# Patient Record
Sex: Male | Born: 1975 | Race: Black or African American | Hispanic: No | Marital: Single | Smoking: Former smoker
Health system: Southern US, Community
[De-identification: ages and names within clinical notes are randomized; demographics above are authoritative.]

## PROBLEM LIST (undated history)

## (undated) DIAGNOSIS — M4800 Spinal stenosis, site unspecified: Secondary | ICD-10-CM

---

## 2007-05-09 ENCOUNTER — Emergency Department (HOSPITAL_COMMUNITY): Admission: EM | Admit: 2007-05-09 | Discharge: 2007-05-09 | Payer: Self-pay | Admitting: Emergency Medicine

## 2010-02-13 ENCOUNTER — Emergency Department (HOSPITAL_COMMUNITY): Admission: EM | Admit: 2010-02-13 | Discharge: 2010-02-13 | Payer: Self-pay | Admitting: Emergency Medicine

## 2011-05-13 LAB — GC/CHLAMYDIA PROBE AMP, GENITAL
Chlamydia, DNA Probe: POSITIVE — AB
GC Probe Amp, Genital: NEGATIVE

## 2011-07-23 IMAGING — CR DG FOOT COMPLETE 3+V*R*
3 series · 3 of 3 positions shown · non-contrast
Comparison: None.

CLINICAL DATA: Twisted ankle while playing basketball; pain across
the metatarsals.

RIGHT FOOT COMPLETE - 3+ VIEW

[t foot ap right]
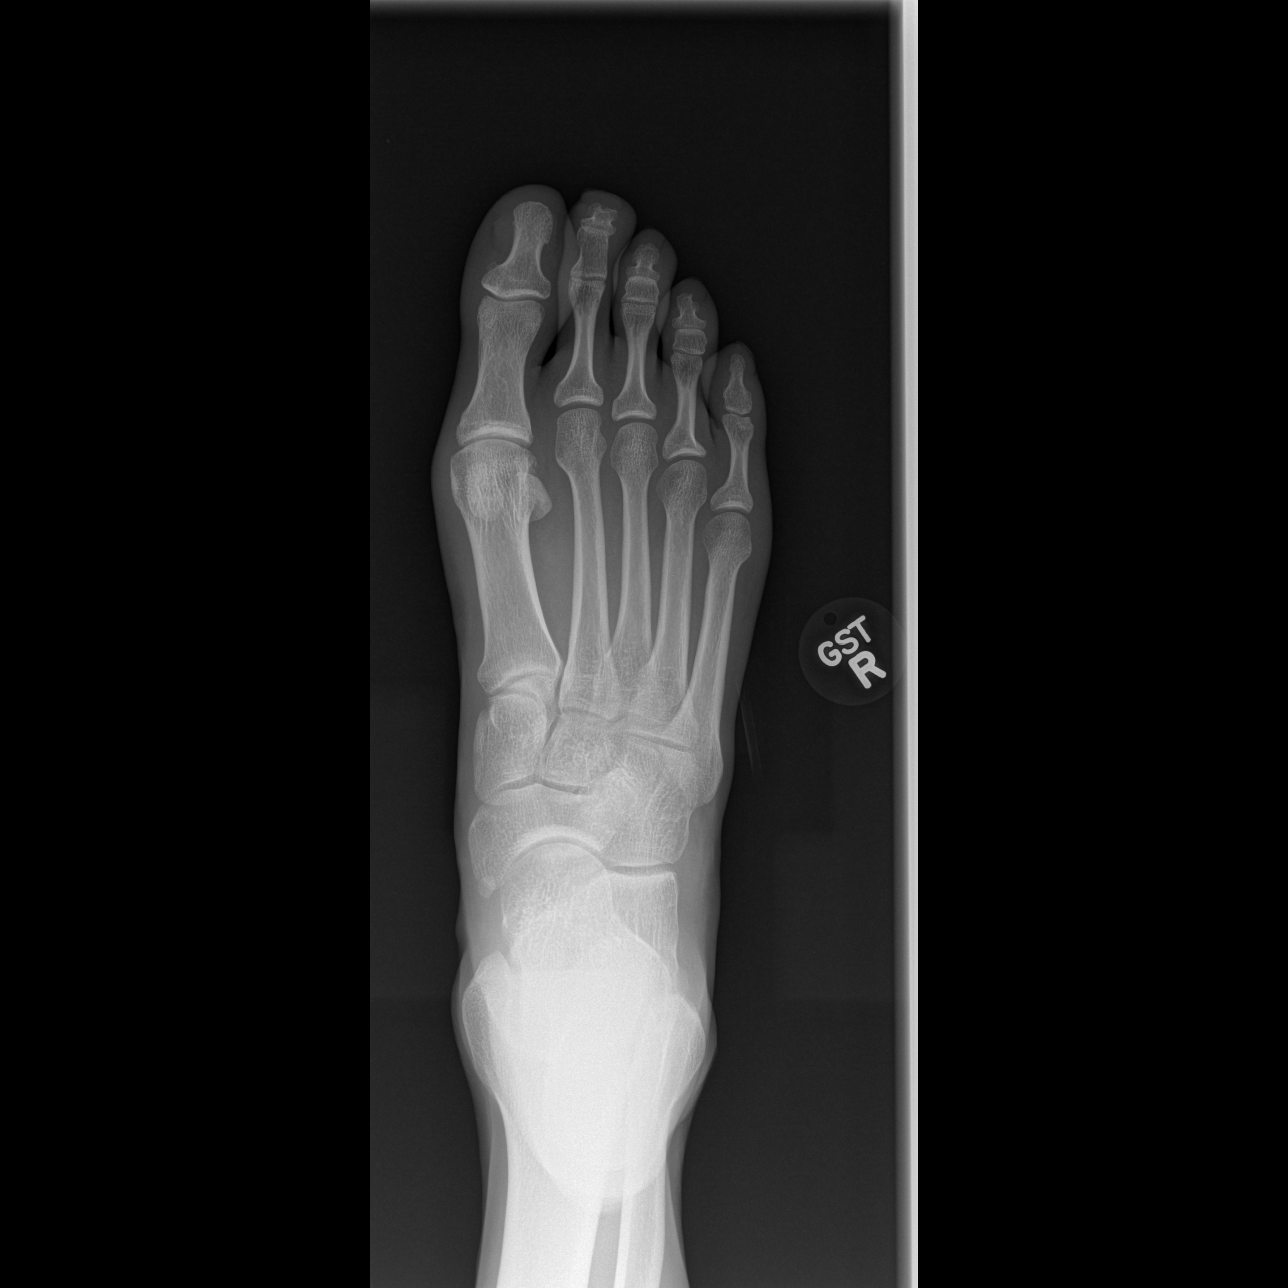

[t foot oblique right *]
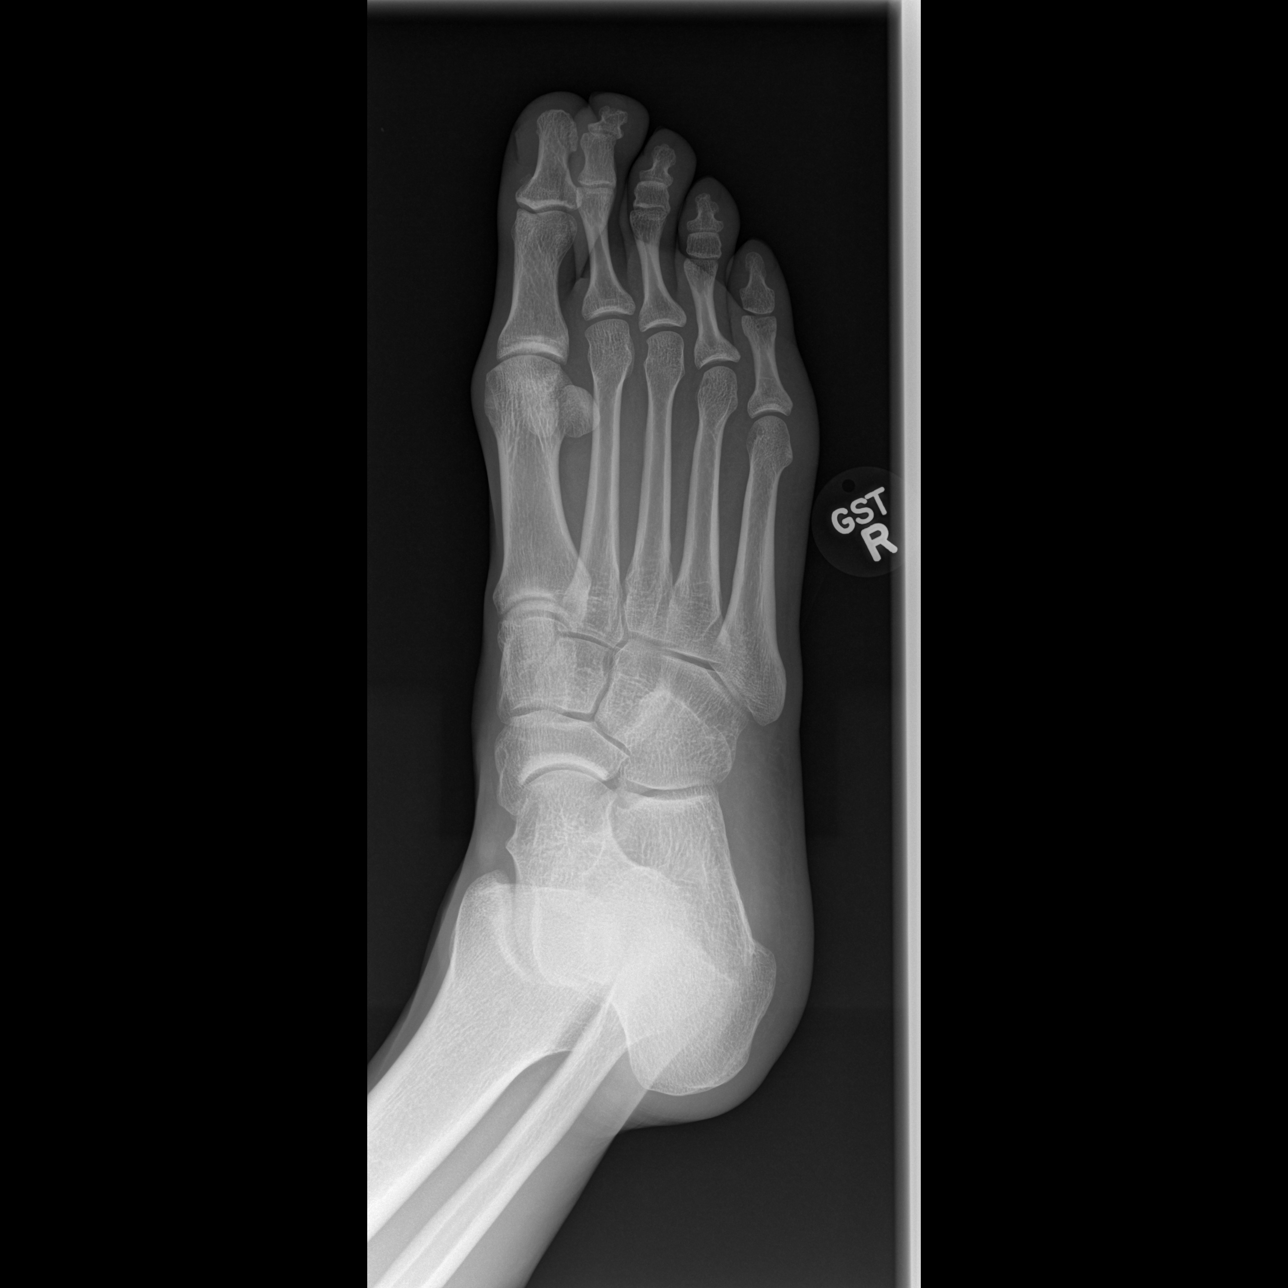

[t foot lat right *]
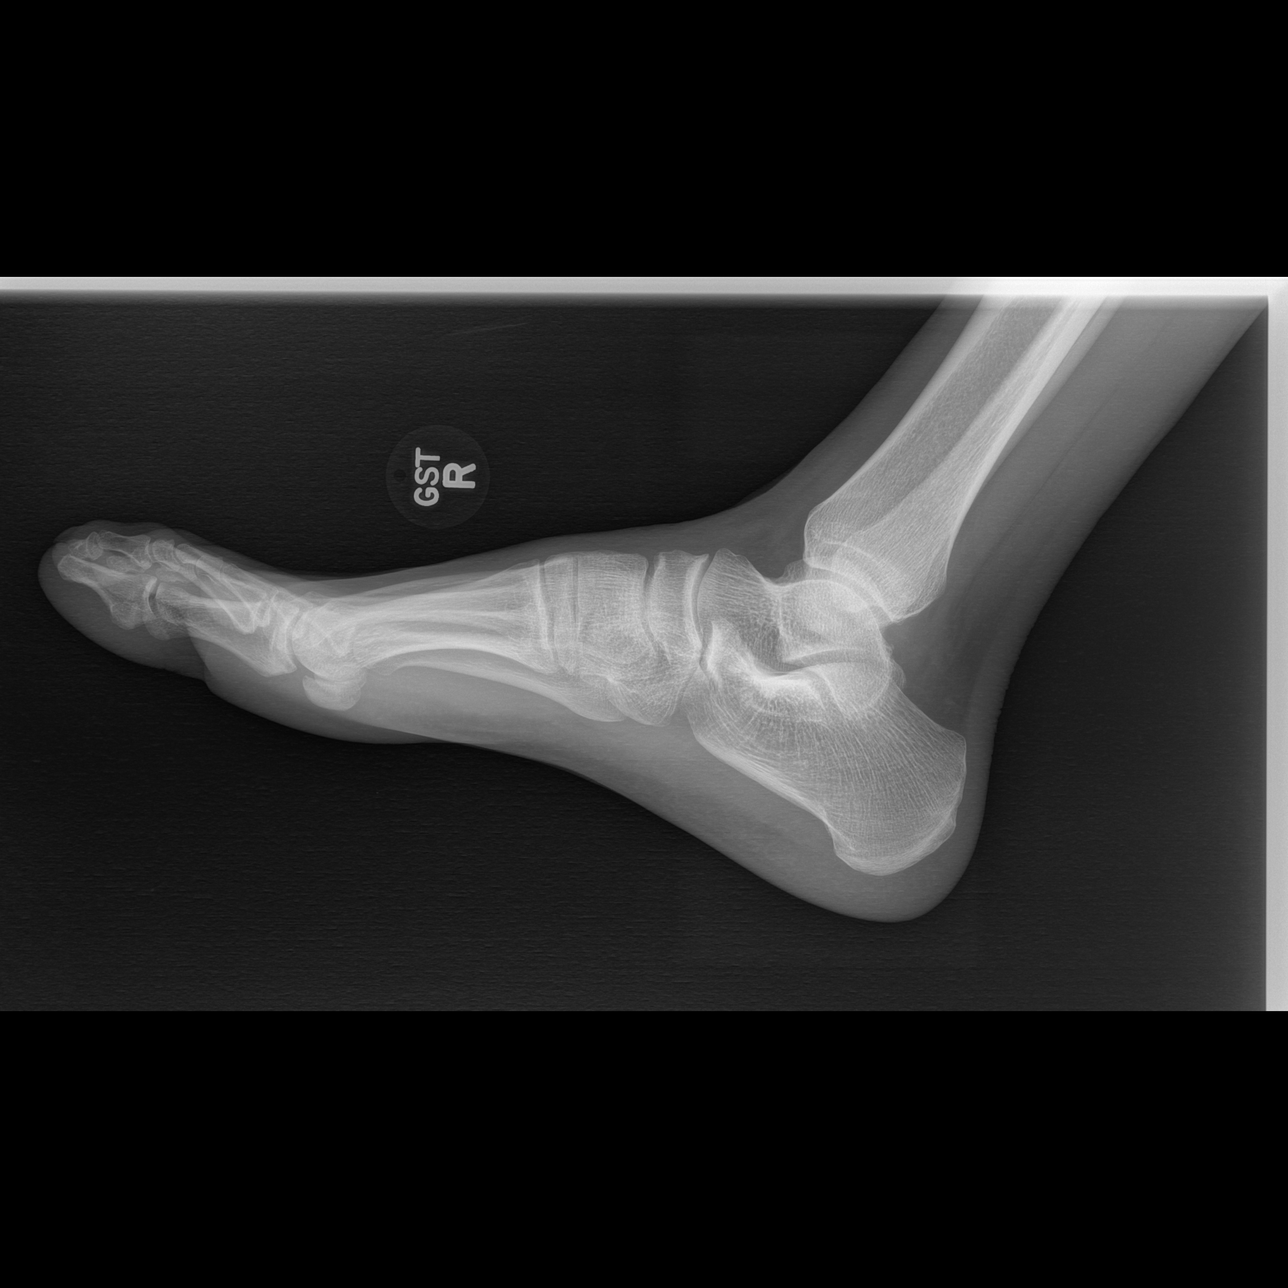

[3 of 3 positions shown; findings below may reference images not displayed]

FINDINGS: There is no evidence of fracture or dislocation.  The
joint spaces are preserved.  There is no evidence of talar
subluxation; the subtalar joint is unremarkable in appearance.

No significant soft tissue abnormalities are seen.
IMPRESSION: No evidence of fracture or dislocation.

## 2012-02-03 ENCOUNTER — Emergency Department (HOSPITAL_COMMUNITY)
Admission: EM | Admit: 2012-02-03 | Discharge: 2012-02-03 | Disposition: A | Discharge status: Home / Self Care | Payer: Self-pay | Attending: Emergency Medicine | Admitting: Emergency Medicine

## 2012-02-03 ENCOUNTER — Encounter (HOSPITAL_COMMUNITY): Payer: Self-pay | Admitting: Emergency Medicine

## 2012-02-03 DIAGNOSIS — K089 Disorder of teeth and supporting structures, unspecified: Secondary | ICD-10-CM | POA: Insufficient documentation

## 2012-02-03 DIAGNOSIS — K047 Periapical abscess without sinus: Secondary | ICD-10-CM

## 2012-02-03 DIAGNOSIS — K029 Dental caries, unspecified: Secondary | ICD-10-CM

## 2012-02-03 DIAGNOSIS — F17200 Nicotine dependence, unspecified, uncomplicated: Secondary | ICD-10-CM | POA: Insufficient documentation

## 2012-02-03 MED ORDER — OXYCODONE-ACETAMINOPHEN 5-325 MG PO TABS
2.0000 | ORAL_TABLET | Freq: Once | ORAL | Status: AC
  Administered 2012-02-03: 2 via ORAL
  Filled 2012-02-03: qty 2

## 2012-02-03 MED ORDER — IBUPROFEN 800 MG PO TABS: 800.0000 mg | ORAL_TABLET | Freq: Three times a day (TID) | ORAL | Status: DC

## 2012-02-03 MED ORDER — OXYCODONE-ACETAMINOPHEN 5-325 MG PO TABS: 2.0000 | ORAL_TABLET | ORAL | Status: AC | PRN

## 2012-02-03 NOTE — ED Notes (Signed)
PT. REPORTS LEFT UPPER/LOWER MOLAR PAIN FOR SEVERAL DAYS .

## 2012-02-03 NOTE — ED Provider Notes (Signed)
History     CSN: [id number]  Arrival date & time 02/03/12  0202   First MD Initiated Contact with Patient 02/03/12 0225      Chief Complaint  Patient presents with  . Dental Pain    (Consider location/radiation/quality/duration/timing/severity/associated sxs/prior treatment) Patient is a 36 y.o. male presenting with tooth pain. The history is provided by the patient. No language interpreter was used.  Dental PainThe primary symptoms include mouth pain and dental injury. Primary symptoms do not include fever. The symptoms are worsening. The symptoms occur constantly.  Additional symptoms include: dental sensitivity to temperature, gum swelling and gum tenderness. Additional symptoms do not include: purulent gums, trismus, facial swelling and trouble swallowing. Medical issues include: smoking.   L uuper molar cracked chronic and LL molar decay x 2.  States intermittant pain that is chronic.  Gums unremarkable.  History reviewed. No pertinent past medical history.  History reviewed. No pertinent past surgical history.  No family history on file.  History  Substance Use Topics  . Smoking status: Current Everyday Smoker  . Smokeless tobacco: Not on file  . Alcohol Use: Yes      Review of Systems  Constitutional: Negative.  Negative for fever.  HENT: Positive for dental problem. Negative for facial swelling and trouble swallowing.   Eyes: Negative.   Respiratory: Negative.   Cardiovascular: Negative.   Gastrointestinal: Negative.   Neurological: Negative.   Psychiatric/Behavioral: Negative.   All other systems reviewed and are negative.    Allergies  Review of patient's allergies indicates no known allergies.  Home Medications   Current Outpatient Rx  Name Route Sig Dispense Refill  . IBUPROFEN 200 MG PO TABS Oral Take 800 mg by mouth every 6 (six) hours as needed. Tooth pain      BP 130/76  Pulse 60  Temp 98.5 F (36.9 C) (Oral)  Resp 16  SpO2  100%  Physical Exam  Nursing note and vitals reviewed. Constitutional: He is oriented to person, place, and time. He appears well-developed and well-nourished.  HENT:  Head: Normocephalic.  Mouth/Throat:    Eyes: Conjunctivae and EOM are normal. Pupils are equal, round, and reactive to light.  Neck: Normal range of motion. Neck supple.  Cardiovascular: Normal rate.   Pulmonary/Chest: Effort normal.  Abdominal: Soft.  Musculoskeletal: Normal range of motion.  Neurological: He is alert and oriented to person, place, and time.  Skin: Skin is warm and dry.  Psychiatric: He has a normal mood and affect.    ED Course  Procedures (including critical care time)  Labs Reviewed - No data to display No results found.   No diagnosis found.    MDM  Dental pain treated with percocet and ibuprofen with dental follow up Friday or Monday.        Remi Haggard, NP 02/04/12 1253

## 2012-02-03 NOTE — ED Notes (Signed)
Prescription for amoxicillin 500mg  po tid, dispense 30, no refills per frances sanford, pa called in to [facility] on [address] at [phone].

## 2012-02-03 NOTE — ED Notes (Signed)
Prescription called in to [facility] at [phone]; cancelled at [facility] on [address] (store closed today).

## 2012-02-03 NOTE — ED Notes (Signed)
Pt for discharge.Vital signs stable.GCS 15 

## 2012-02-03 NOTE — ED Notes (Signed)
Pt presented to ED with tooth ache and also complains of pain on the left side of the face.

## 2012-02-14 NOTE — ED Provider Notes (Signed)
History/physical exam/procedure(s) were performed by non-physician practitioner and as supervising physician I was immediately available for consultation/collaboration. I have reviewed all notes and am in agreement with care and plan.   Mariellen Blaney S Lynora Dymond, MD 02/14/12 0718 

## 2012-05-29 ENCOUNTER — Emergency Department (HOSPITAL_COMMUNITY)
Admission: EM | Admit: 2012-05-29 | Discharge: 2012-05-29 | Disposition: A | Discharge status: Home / Self Care | Payer: Self-pay | Attending: Emergency Medicine | Admitting: Emergency Medicine

## 2012-05-29 ENCOUNTER — Encounter (HOSPITAL_COMMUNITY): Payer: Self-pay | Admitting: *Deleted

## 2012-05-29 DIAGNOSIS — F17200 Nicotine dependence, unspecified, uncomplicated: Secondary | ICD-10-CM | POA: Insufficient documentation

## 2012-05-29 DIAGNOSIS — K047 Periapical abscess without sinus: Secondary | ICD-10-CM | POA: Insufficient documentation

## 2012-05-29 MED ORDER — PENICILLIN V POTASSIUM 500 MG PO TABS: 500.0000 mg | ORAL_TABLET | Freq: Four times a day (QID) | ORAL | Status: AC

## 2012-05-29 MED ORDER — IBUPROFEN 800 MG PO TABS: 800.0000 mg | ORAL_TABLET | Freq: Three times a day (TID) | ORAL | Status: AC

## 2012-05-29 MED ORDER — OXYCODONE-ACETAMINOPHEN 5-325 MG PO TABS: 1.0000 | ORAL_TABLET | ORAL | Status: DC | PRN

## 2012-05-29 NOTE — ED Provider Notes (Signed)
History     CSN: [id number]  Arrival date & time 05/29/12  4098   First MD Initiated Contact with Patient 05/29/12 [phone]      Chief Complaint  Patient presents with  . Dental Pain    (Consider location/radiation/quality/duration/timing/severity/associated sxs/prior treatment) HPI Hx per PT, dental pain on and off last few months , worse last few days. R upper and lower jaw, sharp in quality, severe, hurts to chew, was supposed to have root canal. No DDS. No fevers, no vomiting, no SOB. No known alleviating factors.  History reviewed. No pertinent past medical history.  History reviewed. No pertinent past surgical history.  No family history on file.  History  Substance Use Topics  . Smoking status: Current Every Day Smoker -- 0.2 packs/day  . Smokeless tobacco: Not on file  . Alcohol Use: Yes     occasionally      Review of Systems  Constitutional: Negative for fever and chills.  HENT: Positive for dental problem. Negative for neck pain and neck stiffness.   Eyes: Negative for pain.  Respiratory: Negative for shortness of breath.   Cardiovascular: Negative for chest pain.  Gastrointestinal: Negative for vomiting and abdominal pain.  Genitourinary: Negative for dysuria.  Musculoskeletal: Negative for back pain.  Skin: Negative for rash.  Neurological: Negative for headaches.  All other systems reviewed and are negative.    Allergies  Review of patient's allergies indicates no known allergies.  Home Medications   Current Outpatient Rx  Name Route Sig Dispense Refill  . IBUPROFEN 200 MG PO TABS Oral Take 800 mg by mouth every 6 (six) hours as needed. Tooth pain      BP 123/78  Pulse 77  Temp 97.9 F (36.6 C) (Oral)  Resp 18  SpO2 97%  Physical Exam  Constitutional: He is oriented to person, place, and time. He appears well-developed and well-nourished.  HENT:  Head: Normocephalic and atraumatic.       Uvula midline, no trismus, no facial swelling or  erythema. TTP R lower molar without associated gingival swelling or fluctuance   Eyes: EOM are normal. Pupils are equal, round, and reactive to light.  Neck: Neck supple.  Cardiovascular: Regular rhythm and intact distal pulses.   Pulmonary/Chest: Effort normal. No respiratory distress.  Musculoskeletal: Normal range of motion. He exhibits no edema.  Neurological: He is alert and oriented to person, place, and time.  Skin: Skin is warm and dry.    ED Course  Dental Date/Time: 05/29/2012 5:30 AM Performed by: Sunnie Nielsen Authorized by: Sunnie Nielsen Consent: Verbal consent obtained. Risks and benefits: risks, benefits and alternatives were discussed Consent given by: patient Patient understanding: patient states understanding of the procedure being performed Patient consent: the patient's understanding of the procedure matches consent given Procedure consent: procedure consent matches procedure scheduled Required items: required blood products, implants, devices, and special equipment available Patient identity confirmed: verbally with patient Time out: Immediately prior to procedure a "time out" was called to verify the correct patient, procedure, equipment, support staff and site/side marked as required. Preparation: Patient was prepped and draped in the usual sterile fashion. Local anesthesia used: yes Local anesthetic: bupivacaine 0.5% without epinephrine Anesthetic total: 1.8 ml Patient tolerance: Patient tolerated the procedure well with no immediate complications. Comments: R lower molar local dental block with good pain control achieved   (including critical care time)   1. Dental abscess    Plan PCN, pain meds and follow up DDS. Dental infx precautions provided.  MDM   Dental block provided with good pain control. VS and nursing notes reviewed. RX provided.         Sunnie Nielsen, MD 05/29/12 [phone]

## 2012-05-29 NOTE — ED Notes (Signed)
PT was tx here in July for R lower molar pain.  He was given antibiotics and pain meds, but was unable to see a dentist.  He states he has an appointment with a dentist on Fri, but he needs antibiotics and pain meds to cover him until them.

## 2013-11-20 ENCOUNTER — Emergency Department (INDEPENDENT_AMBULATORY_CARE_PROVIDER_SITE_OTHER)
Admission: EM | Admit: 2013-11-20 | Discharge: 2013-11-20 | Disposition: A | Discharge status: Home / Self Care | Payer: Self-pay | Source: Home / Self Care | Attending: Emergency Medicine | Admitting: Emergency Medicine

## 2013-11-20 ENCOUNTER — Encounter (HOSPITAL_COMMUNITY): Payer: Self-pay | Admitting: Emergency Medicine

## 2013-11-20 ENCOUNTER — Other Ambulatory Visit (HOSPITAL_COMMUNITY)
Admission: RE | Admit: 2013-11-20 | Discharge: 2013-11-20 | Disposition: A | Discharge status: Home / Self Care | Payer: Self-pay | Source: Ambulatory Visit | Attending: Emergency Medicine | Admitting: Emergency Medicine

## 2013-11-20 DIAGNOSIS — Z113 Encounter for screening for infections with a predominantly sexual mode of transmission: Secondary | ICD-10-CM | POA: Insufficient documentation

## 2013-11-20 DIAGNOSIS — N342 Other urethritis: Secondary | ICD-10-CM

## 2013-11-20 MED ORDER — CEFTRIAXONE SODIUM 250 MG IJ SOLR
250.0000 mg | Freq: Once | INTRAMUSCULAR | Status: AC
  Administered 2013-11-20: 250 mg via INTRAMUSCULAR

## 2013-11-20 MED ORDER — CEFTRIAXONE SODIUM 250 MG IJ SOLR
INTRAMUSCULAR | Status: AC
  Filled 2013-11-20: qty 250

## 2013-11-20 MED ORDER — AZITHROMYCIN 250 MG PO TABS
1000.0000 mg | ORAL_TABLET | Freq: Once | ORAL | Status: AC
  Administered 2013-11-20: 1000 mg via ORAL

## 2013-11-20 MED ORDER — LIDOCAINE HCL (PF) 1 % IJ SOLN
INTRAMUSCULAR | Status: AC
  Filled 2013-11-20: qty 5

## 2013-11-20 MED ORDER — AZITHROMYCIN 250 MG PO TABS
ORAL_TABLET | ORAL | Status: AC
  Filled 2013-11-20: qty 4

## 2013-11-20 NOTE — ED Provider Notes (Signed)
  Chief Complaint   Chief Complaint  Patient presents with  . Dysuria    History of Present Illness   Justin Weaver is a 38 year old male who has had a 2 to three-day history of discharge and dysuria. He denies any lesions of the penis, and will adenopathy, testicular pain or swelling, or abdominal pain, nausea, or vomiting. He's had no fever, skin rash, or joint pain. The patient has been sexually active with 2 partners in the last 3 months. He thinks he caught this infection from a new partner who was [place] from [place]. He has had a history of Chlamydia in the past.  Review of Systems   Other than as noted above, the patient denies any of the following symptoms: Systemic:  No fevers chills, arthralgias, or adenopathy. GI:  No abdominal pain, nausea or vomiting. GU:  No dysuria, penile pain, discharge, itching, dysuria, genital lesions, testicular pain or swelling. Skin:  No rash or itching.  PMFSH   Past medical history, family history, social history, meds, and allergies were reviewed.   Physical Examination    Vital signs:  BP 98/64  Pulse 93  Temp(Src) 98.4 F (36.9 C) (Oral)  Resp 16  SpO2 98% Gen:  Alert, oriented, in no distress. Abdomen:  Soft and flat, non-distended, and non-tender.  No organomegaly or mass. Genital:  He has a yellowish, thick, purulent drainage coming from the urethra. There were no lesions of the penis. No inguinal adenopathy, testes were normal. Skin:  Warm and dry.  No rash.   Course in Urgent Care Center   He was given Rocephin 250 mg IM and azithromycin 1000 mg by mouth.  Assessment   The encounter diagnosis was Urethritis.  He declines testing for HIV or syphilis.  Plan    1.  Meds:  The following meds were prescribed:   Discharge Medication List as of 11/20/2013  9:53 PM      2.  Patient Education/Counseling:  The patient was given appropriate handouts, self care instructions, and instructed in symptomatic relief.The  patient was instructed to inform all sexual contacts, avoid intercourse completely for 2 weeks and then only with a condom.  The patient was told that we would call about all abnormal lab results, and that we would need to report certain kinds of infection to the health department.    3.  Follow up:  The patient was told to follow up here if no better in 3 to 4 days, or sooner if becoming worse in any way, and given some red flag symptoms such as fever, pain, or difficulty urinating which would prompt immediate return.      Reuben Likesavid C Marnita Poirier, MD 11/20/13 2240

## 2013-11-20 NOTE — ED Notes (Signed)
MD evaluation only 

## 2013-11-20 NOTE — Discharge Instructions (Signed)
You have been diagnosed with a possible STD.  Your results should be back in  1 - 3 days.  We will call you with the results of any positive tests, so if you don't hear from us, you can assume your results are all negative.  If you wish, you can call us here at [phone] and ask for a nurse to give you the results.  They can give you the results of all your tests over the phone.  If your HIV should come back positive, we must give you this result in person to protect your confidentiality.  We can give you a negative HIV result over the phone.   ° °In the meantime, you should avoid intercourse altogether for 1 week.  After that, you should always use condoms--100% of the time.  This will not only prevent pregnancy, but has been shown to prevent HIV, syphilis, gonorrhea, chlamydia, hepatis C and other STDs. ° °If your test comes back positive, we are required by law to report it to the Health Department.  We also suggest you inform your partner or partners so they can get tested and treated as well. ° °You can get STD testing for free at the [facility].  It is recommended that you have repeat testing for HIV and syphilis in 3 and 6 months, since it can take a while for these tests to become positive.  ° °

## 2013-11-21 LAB — URINE CYTOLOGY ANCILLARY ONLY
Chlamydia: NEGATIVE
NEISSERIA GONORRHEA: NEGATIVE
Trichomonas: NEGATIVE

## 2013-11-28 NOTE — ED Notes (Signed)
Call from patient, c/o vomited as soon as he got home on 4-21, and wanted new Rx for 4 pills called in for him. After review of negative reports, patient was advised no need for new Rx

## 2015-05-16 ENCOUNTER — Emergency Department (HOSPITAL_COMMUNITY)
Admission: EM | Admit: 2015-05-16 | Discharge: 2015-05-16 | Disposition: A | Discharge status: Home / Self Care | Payer: Self-pay | Attending: Emergency Medicine | Admitting: Emergency Medicine

## 2015-05-16 ENCOUNTER — Encounter (HOSPITAL_COMMUNITY): Payer: Self-pay | Admitting: Nurse Practitioner

## 2015-05-16 DIAGNOSIS — Z791 Long term (current) use of non-steroidal anti-inflammatories (NSAID): Secondary | ICD-10-CM | POA: Insufficient documentation

## 2015-05-16 DIAGNOSIS — Z720 Tobacco use: Secondary | ICD-10-CM | POA: Insufficient documentation

## 2015-05-16 DIAGNOSIS — Z113 Encounter for screening for infections with a predominantly sexual mode of transmission: Secondary | ICD-10-CM

## 2015-05-16 DIAGNOSIS — A64 Unspecified sexually transmitted disease: Secondary | ICD-10-CM | POA: Insufficient documentation

## 2015-05-16 DIAGNOSIS — R369 Urethral discharge, unspecified: Secondary | ICD-10-CM | POA: Insufficient documentation

## 2015-05-16 MED ORDER — STERILE WATER FOR INJECTION IJ SOLN
INTRAMUSCULAR | Status: AC
  Filled 2015-05-16: qty 10

## 2015-05-16 MED ORDER — STERILE WATER FOR INJECTION IJ SOLN
0.9000 mL | Freq: Once | INTRAMUSCULAR | Status: AC
  Administered 2015-05-16: 0.9 mL via INTRAMUSCULAR

## 2015-05-16 MED ORDER — AZITHROMYCIN 250 MG PO TABS
1000.0000 mg | ORAL_TABLET | Freq: Once | ORAL | Status: AC
  Administered 2015-05-16: 1000 mg via ORAL
  Filled 2015-05-16: qty 4

## 2015-05-16 MED ORDER — CEFTRIAXONE SODIUM 250 MG IJ SOLR
250.0000 mg | Freq: Once | INTRAMUSCULAR | Status: AC
  Administered 2015-05-16: 250 mg via INTRAMUSCULAR
  Filled 2015-05-16: qty 250

## 2015-05-16 NOTE — Discharge Instructions (Signed)
Sexually Transmitted Disease °A sexually transmitted disease (STD) is a disease or infection that may be passed (transmitted) from person to person, usually during sexual activity. This may happen by way of saliva, semen, blood, vaginal mucus, or urine. Common STDs include: °· Gonorrhea. °· Chlamydia. °· Syphilis. °· HIV and AIDS. °· Genital herpes. °· Hepatitis B and C. °· Trichomonas. °· Human papillomavirus (HPV). °· Pubic lice. °· Scabies. °· Mites. °· Bacterial vaginosis. °WHAT ARE CAUSES OF STDs? °An STD may be caused by bacteria, a virus, or parasites. STDs are often transmitted during sexual activity if one person is infected. However, they may also be transmitted through nonsexual means. STDs may be transmitted after:  °· Sexual intercourse with an infected person. °· Sharing sex toys with an infected person. °· Sharing needles with an infected person or using unclean piercing or tattoo needles. °· Having intimate contact with the genitals, mouth, or rectal areas of an infected person. °· Exposure to infected fluids during birth. °WHAT ARE THE SIGNS AND SYMPTOMS OF STDs? °Different STDs have different symptoms. Some people may not have any symptoms. If symptoms are present, they may include: °· Painful or bloody urination. °· Pain in the pelvis, abdomen, vagina, anus, throat, or eyes. °· A skin rash, itching, or irritation. °· Growths, ulcerations, blisters, or sores in the genital and anal areas. °· Abnormal vaginal discharge with or without bad odor. °· Penile discharge in men. °· Fever. °· Pain or bleeding during sexual intercourse. °· Swollen glands in the groin area. °· Yellow skin and eyes (jaundice). This is seen with hepatitis. °· Swollen testicles. °· Infertility. °· Sores and blisters in the mouth. °HOW ARE STDs DIAGNOSED? °To make a diagnosis, your health care provider may: °· Take a medical history. °· Perform a physical exam. °· Take a sample of any discharge to examine. °· Swab the throat,  cervix, opening to the penis, rectum, or vagina for testing. °· Test a sample of your first morning urine. °· Perform blood tests. °· Perform a Pap test, if this applies. °· Perform a colposcopy. °· Perform a laparoscopy. °HOW ARE STDs TREATED? °Treatment depends on the STD. Some STDs may be treated but not cured. °· Chlamydia, gonorrhea, trichomonas, and syphilis can be cured with antibiotic medicine. °· Genital herpes, hepatitis, and HIV can be treated, but not cured, with prescribed medicines. The medicines lessen symptoms. °· Genital warts from HPV can be treated with medicine or by freezing, burning (electrocautery), or surgery. Warts may come back. °· HPV cannot be cured with medicine or surgery. However, abnormal areas may be removed from the cervix, vagina, or vulva. °· If your diagnosis is confirmed, your recent sexual partners need treatment. This is true even if they are symptom-free or have a negative culture or evaluation. They should not have sex until their health care providers say it is okay. °· Your health care provider may test you for infection again 3 months after treatment. °HOW CAN I REDUCE MY RISK OF GETTING AN STD? °Take these steps to reduce your risk of getting an STD: °· Use latex condoms, dental dams, and water-soluble lubricants during sexual activity. Do not use petroleum jelly or oils. °· Avoid having multiple sex partners. °· Do not have sex with someone who has other sex partners °· Do not have sex with anyone you do not know or who is at high risk for an STD. °· Avoid risky sex practices that can break your skin. °· Do not have sex   if you have open sores on your mouth or skin. °· Avoid drinking too much alcohol or taking illegal drugs. Alcohol and drugs can affect your judgment and put you in a vulnerable position. °· Avoid engaging in oral and anal sex acts. °· Get vaccinated for HPV and hepatitis. If you have not received these vaccines in the past, talk to your health care  provider about whether one or both might be right for you. °· If you are at risk of being infected with HIV, it is recommended that you take a prescription medicine daily to prevent HIV infection. This is called pre-exposure prophylaxis (PrEP). You are considered at risk if: °¨ You are a man who has sex with other men (MSM). °¨ You are a heterosexual man or woman and are sexually active with more than one partner. °¨ You take drugs by injection. °¨ You are sexually active with a partner who has HIV. °· Talk with your health care provider about whether you are at high risk of being infected with HIV. If you choose to begin PrEP, you should first be tested for HIV. You should then be tested every 3 months for as long as you are taking PrEP. °WHAT SHOULD I DO IF I THINK I HAVE AN STD? °· See your health care provider. °· Tell your sexual partner(s). They should be tested and treated for any STDs. °· Do not have sex until your health care provider says it is okay. °WHEN SHOULD I GET IMMEDIATE MEDICAL CARE? °Contact your health care provider right away if:  °· You have severe abdominal pain. °· You are a man and notice swelling or pain in your testicles. °· You are a woman and notice swelling or pain in your vagina. °  °This information is not intended to replace advice given to you by your health care provider. Make sure you discuss any questions you have with your health care provider. °  °Document Released: 10/09/2002 Document Revised: 08/09/2014 Document Reviewed: 02/06/2013 °Elsevier Interactive Patient Education ©2016 [employer] ° °

## 2015-05-16 NOTE — ED Provider Notes (Signed)
CSN: [id number]     Arrival date & time 05/16/15  1517 History  By signing my name below, I, Justin Weaver, attest that this documentation has been prepared under the direction and in the presence of Roxy Horseman, PA-C Electronically Signed: Soijett Weaver, ED Scribe. 05/16/2015. 4:16 PM.   Chief Complaint  Patient presents with  . Dysuria      The history is provided by the patient. No language interpreter was used.    Justin Weaver is a 39 y.o. male who presents to the Emergency Department complaining of dysuria onset last night. He notes that he had a new sexual partner recently and the condom broke while he was having sexual intercourse. He states that she is having associated symptoms of clear penile discharge x yesterday. He states that he has not tried any medications for the relief for his symptoms. He denies penile pain/swelling, testicular pain/swelling, fever, chill, and any other symptoms. Denies allergies to any medications.    History reviewed. No pertinent past medical history. History reviewed. No pertinent past surgical history. History reviewed. No pertinent family history. Social History  Substance Use Topics  . Smoking status: Current Every Day Smoker -- 0.25 packs/day    Types: Cigarettes  . Smokeless tobacco: None  . Alcohol Use: Yes     Comment: occasionally    Review of Systems  Constitutional: Negative for fever and chills.  Genitourinary: Positive for dysuria and discharge. Negative for penile swelling, scrotal swelling, penile pain and testicular pain.  Skin: Negative for color change and rash.      Allergies  Review of patient's allergies indicates no known allergies.  Home Medications   Prior to Admission medications   Medication Sig Start Date End Date Taking? Authorizing Provider  ibuprofen (ADVIL,MOTRIN) 200 MG tablet Take 800 mg by mouth every 6 (six) hours as needed. Tooth pain    Historical Provider, MD  ibuprofen (ADVIL,MOTRIN) 800  MG tablet Take 1 tablet (800 mg total) by mouth 3 (three) times daily. 05/29/12   Sunnie Nielsen, MD  oxyCODONE-acetaminophen (PERCOCET/ROXICET) 5-325 MG per tablet Take 1 tablet by mouth every 4 (four) hours as needed for pain. 05/29/12   Sunnie Nielsen, MD   BP 118/80 mmHg  Pulse 76  Temp(Src) 97.8 F (36.6 C) (Oral)  Resp 20  SpO2 100% Physical Exam  Constitutional: He is oriented to person, place, and time. He appears well-developed and well-nourished. No distress.  HENT:  Head: Normocephalic and atraumatic.  Eyes: EOM are normal.  Neck: Neck supple.  Cardiovascular: Normal rate.   Pulmonary/Chest: Effort normal. No respiratory distress.  Genitourinary: Testes normal. Discharge found.  Mild yellow penile discharge. No other abnormality about the penis, scrotum, or testes.  Musculoskeletal: Normal range of motion.  Neurological: He is alert and oriented to person, place, and time.  Skin: Skin is warm and dry.  Psychiatric: He has a normal mood and affect. His behavior is normal.  Nursing note and vitals reviewed.   ED Course  Procedures (including critical care time) DIAGNOSTIC STUDIES: Oxygen Saturation is 100% on RA, nl by my interpretation.    COORDINATION OF CARE: 4:09 PM Discussed treatment plan with pt at bedside which includes GC/Chlamydia probe and pt agreed to plan.     MDM   Final diagnoses:  Penile discharge  Screen for STD (sexually transmitted disease)    Patient with penile discharge.  Will treat with rocephin and azithro.  Cultures pending.  Non-toxic, well-appearing.  I, Roxy Horseman, personally  performed the services described in this documentation. All medical record entries made by the scribe were at my direction and in my presence.  I have reviewed the chart and discharge instructions and agree that the record reflects my personal performance and is accurate and complete. Aarionna Germer.  05/16/2015. 4:35 PM.       Roxy Horsemanobert Damonte Frieson,  PA-C 05/16/15 1635  Gerhard Munchobert Lockwood, MD 05/17/15 [phone]

## 2015-05-16 NOTE — ED Notes (Addendum)
He c/o dysuria and clear penile discharge onset yesterday. Denies incontinence, frequency, hesitancy fevers, n/v, abd pain, pelvic pain. He reports condom broke during intercourse several days ago

## 2015-05-19 LAB — GC/CHLAMYDIA PROBE AMP (~~LOC~~) NOT AT ARMC
CHLAMYDIA, DNA PROBE: NEGATIVE
NEISSERIA GONORRHEA: POSITIVE — AB

## 2015-05-20 ENCOUNTER — Telehealth (HOSPITAL_COMMUNITY): Payer: Self-pay

## 2015-05-20 NOTE — Telephone Encounter (Signed)
Spoke with pt. Verified ID. Informed of labs. Treated per protocol. [employer] form faxed. Pt informed to abstain from sexual activity x 10 days and to notify partner for testing and treatment.  

## 2015-05-20 NOTE — Telephone Encounter (Signed)
Positive for gonorrhea. Treated per protocol. [employer] form faxed. Attempting to contact.  

## 2017-03-14 ENCOUNTER — Inpatient Hospital Stay (HOSPITAL_COMMUNITY)
Admission: EM | Admit: 2017-03-14 | Discharge: 2017-03-16 | DRG: 349 | Disposition: A | Discharge status: Home / Self Care | Payer: Self-pay | Attending: Surgery | Admitting: Surgery

## 2017-03-14 ENCOUNTER — Encounter (HOSPITAL_COMMUNITY): Payer: Self-pay | Admitting: Nurse Practitioner

## 2017-03-14 ENCOUNTER — Emergency Department (HOSPITAL_COMMUNITY): Payer: Self-pay

## 2017-03-14 DIAGNOSIS — K610 Anal abscess: Secondary | ICD-10-CM

## 2017-03-14 DIAGNOSIS — K611 Rectal abscess: Secondary | ICD-10-CM | POA: Diagnosis present

## 2017-03-14 DIAGNOSIS — K612 Anorectal abscess: Principal | ICD-10-CM | POA: Diagnosis present

## 2017-03-14 DIAGNOSIS — F17210 Nicotine dependence, cigarettes, uncomplicated: Secondary | ICD-10-CM | POA: Diagnosis present

## 2017-03-14 LAB — CBC WITH DIFFERENTIAL/PLATELET
BASOS PCT: 1 %
Basophils Absolute: 0.1 10*3/uL (ref 0.0–0.1)
EOS PCT: 1 %
Eosinophils Absolute: 0.1 10*3/uL (ref 0.0–0.7)
HEMATOCRIT: 31.8 % — AB (ref 39.0–52.0)
HEMOGLOBIN: 10.5 g/dL — AB (ref 13.0–17.0)
LYMPHS ABS: 2.3 10*3/uL (ref 0.7–4.0)
Lymphocytes Relative: 19 %
MCH: 26.8 pg (ref 26.0–34.0)
MCHC: 33 g/dL (ref 30.0–36.0)
MCV: 81.1 fL (ref 78.0–100.0)
MONOS PCT: 13 %
Monocytes Absolute: 1.6 10*3/uL — ABNORMAL HIGH (ref 0.1–1.0)
NEUTROS PCT: 66 %
Neutro Abs: 8.1 10*3/uL — ABNORMAL HIGH (ref 1.7–7.7)
Platelets: 278 10*3/uL (ref 150–400)
RBC: 3.92 MIL/uL — AB (ref 4.22–5.81)
RDW: 15.4 % (ref 11.5–15.5)
WBC: 12.2 10*3/uL — AB (ref 4.0–10.5)

## 2017-03-14 LAB — I-STAT CHEM 8, ED
BUN: 14 mg/dL (ref 6–20)
CREATININE: 1.1 mg/dL (ref 0.61–1.24)
Calcium, Ion: 1.04 mmol/L — ABNORMAL LOW (ref 1.15–1.40)
Chloride: 102 mmol/L (ref 101–111)
Glucose, Bld: 103 mg/dL — ABNORMAL HIGH (ref 65–99)
HEMATOCRIT: 32 % — AB (ref 39.0–52.0)
Hemoglobin: 10.9 g/dL — ABNORMAL LOW (ref 13.0–17.0)
POTASSIUM: 3.1 mmol/L — AB (ref 3.5–5.1)
Sodium: 139 mmol/L (ref 135–145)
TCO2: 27 mmol/L (ref 0–100)

## 2017-03-14 MED ORDER — ONDANSETRON HCL 4 MG/2ML IJ SOLN
4.0000 mg | Freq: Once | INTRAMUSCULAR | Status: AC
  Administered 2017-03-14: 4 mg via INTRAVENOUS
  Filled 2017-03-14: qty 2

## 2017-03-14 MED ORDER — IOPAMIDOL (ISOVUE-300) INJECTION 61%
INTRAVENOUS | Status: AC
  Administered 2017-03-14: 100 mL
  Filled 2017-03-14: qty 100

## 2017-03-14 MED ORDER — PIPERACILLIN-TAZOBACTAM 3.375 G IVPB 30 MIN
3.3750 g | Freq: Once | INTRAVENOUS | Status: AC
  Administered 2017-03-14: 3.375 g via INTRAVENOUS
  Filled 2017-03-14: qty 50

## 2017-03-14 MED ORDER — HYDROMORPHONE HCL 1 MG/ML IJ SOLN
1.0000 mg | Freq: Once | INTRAMUSCULAR | Status: AC
  Administered 2017-03-14: 1 mg via INTRAVENOUS
  Filled 2017-03-14: qty 1

## 2017-03-14 MED ORDER — SODIUM CHLORIDE 0.9 % IV SOLN
Freq: Once | INTRAVENOUS | Status: AC
  Administered 2017-03-14: 50 mL/h via INTRAVENOUS

## 2017-03-14 MED ORDER — VANCOMYCIN HCL IN DEXTROSE 1-5 GM/200ML-% IV SOLN
1000.0000 mg | Freq: Once | INTRAVENOUS | Status: AC
  Administered 2017-03-14: 1000 mg via INTRAVENOUS
  Filled 2017-03-14: qty 200

## 2017-03-14 NOTE — ED Triage Notes (Signed)
Pt presents with c/o perineal abscess. The abscess began about a week ago. The abscess is extremely tender to touch and has been draining pus today. He wen to an urgent care and they sent for further evaluation and possible surgical consult/iv antibiotics.

## 2017-03-14 NOTE — ED Provider Notes (Signed)
[facility]-EMERGENCY DEPT Provider Note   CSN: [id number] Arrival date & time: 03/14/17  1717     History   Chief Complaint Chief Complaint  Patient presents with  . Abscess    HPI Justin Weaver is a 41 y.o. male.  A 41 year old male who presents with a perianal abscess that has been present for approximately one week.  He noted today that it "ruptured" and has been draining purulent bloody material.  He reports difficulty urinating and having bowel movements for the past several days.  Denies any fever. He went to urgent care today and he sent him for further evaluation, possible IV antibiotics and surgery due to its location and size. He states approximately a year ago he had abscess in the same location but caught it early enough that he was just given antibiotics and warm compresses with total resolution.      History reviewed. No pertinent past medical history.  There are no active problems to display for this patient.   History reviewed. No pertinent surgical history.     Home Medications    Prior to Admission medications   Medication Sig Start Date End Date Taking? Authorizing Provider  acetaminophen (TYLENOL) 325 MG tablet Take 325-650 mg by mouth every 6 (six) hours as needed (for pain or headaches).   Yes [provider]  ibuprofen (ADVIL,MOTRIN) 200 MG tablet Take 200-600 mg by mouth every 6 (six) hours as needed (for pain or headaches). Tooth pain    Yes [provider]  ibuprofen (ADVIL,MOTRIN) 800 MG tablet Take 1 tablet (800 mg total) by mouth 3 (three) times daily. Patient not taking: Reported on 03/14/2017 05/29/12   Sunnie Nielsenpitz, Brian, MD  oxyCODONE-acetaminophen (PERCOCET/ROXICET) 5-325 MG per tablet Take 1 tablet by mouth every 4 (four) hours as needed for pain. Patient not taking: Reported on 03/14/2017 05/29/12   Sunnie Nielsenpitz, Brian, MD    Family History History reviewed. No pertinent family history.  Social History Social History  Substance  Use Topics  . Smoking status: Current Every Day Smoker    Packs/day: 0.25    Types: Cigarettes  . Smokeless tobacco: Never Used  . Alcohol use Yes     Comment: occasionally     Allergies   Patient has no known allergies.   Review of Systems Review of Systems  Constitutional: Negative for fever.  Gastrointestinal: Positive for abdominal pain. Negative for nausea and vomiting.  Genitourinary: Positive for dysuria.  Skin: Positive for wound.  All other systems reviewed and are negative.    Physical Exam Updated Vital Signs BP 120/87   Pulse 62   Temp 98.1 F (36.7 C) (Oral)   Resp 14   Wt 68 kg (150 lb)   SpO2 100%   Physical Exam  Constitutional: He appears well-developed and well-nourished. No distress.  HENT:  Head: Normocephalic.  Eyes: Pupils are equal, round, and reactive to light.  Neck: Normal range of motion.  Cardiovascular: Normal rate.   Pulmonary/Chest: Effort normal.  Abdominal: Soft. Bowel sounds are normal.  Genitourinary: Rectal exam shows tenderness. Rectal exam shows anal tone normal.     Musculoskeletal: Normal range of motion.  Neurological: He is alert.  Skin: Skin is warm.  Nursing note and vitals reviewed.    ED Treatments / Results  Labs (all labs ordered are listed, but only abnormal results are displayed) Labs Reviewed  CBC WITH DIFFERENTIAL/PLATELET - Abnormal; Notable for the following:       Result Value   WBC  12.2 (*)    RBC 3.92 (*)    Hemoglobin 10.5 (*)    HCT 31.8 (*)    Neutro Abs 8.1 (*)    Monocytes Absolute 1.6 (*)    All other components within normal limits  I-STAT CHEM 8, ED - Abnormal; Notable for the following:    Potassium 3.1 (*)    Glucose, Bld 103 (*)    Calcium, Ion 1.04 (*)    Hemoglobin 10.9 (*)    HCT 32.0 (*)    All other components within normal limits    EKG  EKG Interpretation None       Radiology Ct Pelvis W Contrast  Result Date: 03/14/2017 CLINICAL DATA:  Fever and  abdominal/pelvic pain. Purulent drainage. Abscess. EXAM: CT PELVIS WITH CONTRAST TECHNIQUE: Multidetector CT imaging of the pelvis was performed using the standard protocol following the bolus administration of intravenous contrast. CONTRAST:  ISOVUE-300 IOPAMIDOL (ISOVUE-300) INJECTION 61% COMPARISON:  None. FINDINGS: Urinary Tract: Question of bladder wall thickening. No evidence of bladder stone. Distal ureters are decompressed. Bowel: Suboptimal bowel assessment given lack of enteric contrast and paucity of intra-abdominal/pelvic fat. Pelvic bowel loops are fluid-filled without evidence of obstruction. The appendix is not confidently identified. No definite perianal fluid collection. Vascular/Lymphatic: There are enlarged bilateral external and inguinal wound lymph nodes. No acute vascular findings are seen. Reproductive: There is air in the left perineum extending to the penile scrotal junction. There is adjacent ill-defined thickening. Scrotal contents not well-defined by CT. Prostate gland is unremarkable. Other: There is air in the deep perineal structures on the left. Associated soft tissue thickening and possible ill-defined fluid, that may extending into the left scrotal sac. Fluid measures approximately 1.5 cm. No pelvic free fluid. Musculoskeletal: There are no acute or suspicious osseous abnormalities. IMPRESSION: Air in the left perineum with adjacent soft tissue thickening and ill-defined fluid, extending into the base of the penile scrotal junction and possible scrotum, consistent with infection. No well-defined abscess is visualized. Given location and soft tissue air, necrotizing infection is considered. These results were called by telephone at the time of interpretation on 03/14/2017 at 11:35 pm to NP [facility] , who verbally acknowledged these results. Electronically Signed   By: Rubye Oaks M.D.   On: 03/14/2017 23:36    Procedures Procedures (including critical care  time)  Medications Ordered in ED Medications  vancomycin (VANCOCIN) IVPB 1000 mg/200 mL premix (1,000 mg Intravenous New Bag/Given 03/14/17 2350)  0.9 %  sodium chloride infusion (50 mL/hr Intravenous New Bag/Given 03/14/17 2232)  HYDROmorphone (DILAUDID) injection 1 mg (1 mg Intravenous Given 03/14/17 2233)  ondansetron (ZOFRAN) injection 4 mg (4 mg Intravenous Given 03/14/17 2233)  iopamidol (ISOVUE-300) 61 % injection (100 mLs  Contrast Given 03/14/17 2250)  piperacillin-tazobactam (ZOSYN) IVPB 3.375 g (3.375 g Intravenous New Bag/Given 03/14/17 2350)     Initial Impression / Assessment and Plan / ED Course  I have reviewed the triage vital signs and the nursing notes.  Pertinent labs & imaging results that were available during my care of the patient were reviewed by me and considered in my medical decision making (see chart for details).      Labs have been reviewed.  Elevated white count of 12.2, radiology called with concern for air in the perineum that checking into the scrotum concerning for necrotizing skin infection.  Antibiotics have been started.  Vancomycin and Zosyn.  Surgery has been consult at  Final Clinical Impressions(s) / ED Diagnoses  Final diagnoses:  Perianal abscess    New Prescriptions New Prescriptions   No medications on file     Earley Favor, NP 03/15/17 Sima Matas, MD 03/16/17 [phone]

## 2017-03-15 ENCOUNTER — Encounter (HOSPITAL_COMMUNITY): Payer: Self-pay | Admitting: Certified Registered Nurse Anesthetist

## 2017-03-15 ENCOUNTER — Observation Stay (HOSPITAL_COMMUNITY): Payer: Self-pay | Admitting: Certified Registered Nurse Anesthetist

## 2017-03-15 ENCOUNTER — Encounter (HOSPITAL_COMMUNITY): Admission: EM | Disposition: A | Discharge status: Home / Self Care | Payer: Self-pay | Source: Home / Self Care

## 2017-03-15 DIAGNOSIS — K611 Rectal abscess: Secondary | ICD-10-CM | POA: Diagnosis present

## 2017-03-15 HISTORY — PX: INCISION AND DRAINAGE PERIRECTAL ABSCESS: SHX1804

## 2017-03-15 LAB — SURGICAL PCR SCREEN
MRSA, PCR: NEGATIVE
STAPHYLOCOCCUS AUREUS: POSITIVE — AB

## 2017-03-15 SURGERY — INCISION AND DRAINAGE, ABSCESS, PERIRECTAL
Anesthesia: General | Site: Rectum

## 2017-03-15 MED ORDER — 0.9 % SODIUM CHLORIDE (POUR BTL) OPTIME
TOPICAL | Status: DC | PRN
  Administered 2017-03-15: 1000 mL

## 2017-03-15 MED ORDER — NEOSTIGMINE METHYLSULFATE 5 MG/5ML IV SOSY
PREFILLED_SYRINGE | INTRAVENOUS | Status: AC
  Filled 2017-03-15: qty 5

## 2017-03-15 MED ORDER — OXYCODONE HCL 5 MG/5ML PO SOLN: 5.0000 mg | Freq: Once | ORAL | Status: DC | PRN

## 2017-03-15 MED ORDER — GLYCOPYRROLATE 0.2 MG/ML IV SOSY
PREFILLED_SYRINGE | INTRAVENOUS | Status: DC | PRN
Start: 2017-03-15 — End: 2017-03-15
  Administered 2017-03-15: 0.4 mg via INTRAVENOUS
  Administered 2017-03-15: .2 mg via INTRAVENOUS

## 2017-03-15 MED ORDER — OXYCODONE HCL 5 MG PO TABS: 5.0000 mg | ORAL_TABLET | Freq: Once | ORAL | Status: DC | PRN

## 2017-03-15 MED ORDER — MIDAZOLAM HCL 2 MG/2ML IJ SOLN
INTRAMUSCULAR | Status: AC
  Filled 2017-03-15: qty 2

## 2017-03-15 MED ORDER — ROCURONIUM BROMIDE 10 MG/ML (PF) SYRINGE
PREFILLED_SYRINGE | INTRAVENOUS | Status: DC | PRN
  Administered 2017-03-15: 50 mg via INTRAVENOUS

## 2017-03-15 MED ORDER — LIDOCAINE 2% (20 MG/ML) 5 ML SYRINGE
INTRAMUSCULAR | Status: AC
  Filled 2017-03-15: qty 5

## 2017-03-15 MED ORDER — NEOSTIGMINE METHYLSULFATE 5 MG/5ML IV SOSY
PREFILLED_SYRINGE | INTRAVENOUS | Status: DC | PRN
  Administered 2017-03-15: 3 mg via INTRAVENOUS
  Administered 2017-03-15: 1 mg via INTRAVENOUS

## 2017-03-15 MED ORDER — ENOXAPARIN SODIUM 40 MG/0.4ML ~~LOC~~ SOLN
40.0000 mg | SUBCUTANEOUS | Status: DC
  Administered 2017-03-15: 40 mg via SUBCUTANEOUS
  Filled 2017-03-15: qty 0.4

## 2017-03-15 MED ORDER — ONDANSETRON HCL 4 MG/2ML IJ SOLN
4.0000 mg | Freq: Four times a day (QID) | INTRAMUSCULAR | Status: DC | PRN
  Administered 2017-03-15: 4 mg via INTRAVENOUS

## 2017-03-15 MED ORDER — OXYCODONE HCL 5 MG PO TABS
5.0000 mg | ORAL_TABLET | ORAL | Status: DC | PRN
  Administered 2017-03-15 – 2017-03-16 (×5): 10 mg via ORAL
  Filled 2017-03-15 (×5): qty 2

## 2017-03-15 MED ORDER — ONDANSETRON 4 MG PO TBDP: 4.0000 mg | ORAL_TABLET | Freq: Four times a day (QID) | ORAL | Status: DC | PRN

## 2017-03-15 MED ORDER — IBUPROFEN 400 MG PO TABS
400.0000 mg | ORAL_TABLET | Freq: Four times a day (QID) | ORAL | Status: DC | PRN
  Administered 2017-03-15 (×2): 400 mg via ORAL
  Filled 2017-03-15 (×3): qty 1

## 2017-03-15 MED ORDER — PHENYLEPHRINE HCL 10 MG/ML IJ SOLN
INTRAMUSCULAR | Status: DC | PRN
  Administered 2017-03-15: 80 ug via INTRAVENOUS

## 2017-03-15 MED ORDER — POTASSIUM CHLORIDE IN NACL 20-0.9 MEQ/L-% IV SOLN
INTRAVENOUS | Status: DC
  Administered 2017-03-15: 02:00:00 via INTRAVENOUS
  Filled 2017-03-15: qty 1000

## 2017-03-15 MED ORDER — PIPERACILLIN-TAZOBACTAM 3.375 G IVPB 30 MIN
3.3750 g | INTRAVENOUS | Status: AC
  Administered 2017-03-15: 3.375 g via INTRAVENOUS
  Filled 2017-03-15: qty 50

## 2017-03-15 MED ORDER — HYDROMORPHONE HCL 1 MG/ML IJ SOLN: 0.2500 mg | INTRAMUSCULAR | Status: DC | PRN

## 2017-03-15 MED ORDER — PANTOPRAZOLE SODIUM 40 MG IV SOLR
40.0000 mg | Freq: Every day | INTRAVENOUS | Status: DC
  Administered 2017-03-15: 40 mg via INTRAVENOUS
  Filled 2017-03-15: qty 40

## 2017-03-15 MED ORDER — ROCURONIUM BROMIDE 10 MG/ML (PF) SYRINGE
PREFILLED_SYRINGE | INTRAVENOUS | Status: AC
  Filled 2017-03-15: qty 5

## 2017-03-15 MED ORDER — DEXAMETHASONE SODIUM PHOSPHATE 10 MG/ML IJ SOLN
INTRAMUSCULAR | Status: AC
Start: 2017-03-15 — End: 2017-03-15
  Filled 2017-03-15: qty 1

## 2017-03-15 MED ORDER — PROPOFOL 10 MG/ML IV BOLUS
INTRAVENOUS | Status: AC
  Filled 2017-03-15: qty 40

## 2017-03-15 MED ORDER — PROCHLORPERAZINE EDISYLATE 5 MG/ML IJ SOLN: 5.0000 mg | Freq: Four times a day (QID) | INTRAMUSCULAR | Status: DC | PRN

## 2017-03-15 MED ORDER — HYDROMORPHONE HCL 1 MG/ML IJ SOLN
1.0000 mg | INTRAMUSCULAR | Status: DC | PRN
  Administered 2017-03-15: 1 mg via INTRAVENOUS
  Filled 2017-03-15: qty 1

## 2017-03-15 MED ORDER — PROCHLORPERAZINE MALEATE 10 MG PO TABS
10.0000 mg | ORAL_TABLET | Freq: Four times a day (QID) | ORAL | Status: DC | PRN
  Filled 2017-03-15: qty 1

## 2017-03-15 MED ORDER — FENTANYL CITRATE (PF) 100 MCG/2ML IJ SOLN
INTRAMUSCULAR | Status: DC | PRN
  Administered 2017-03-15: 50 ug via INTRAVENOUS
  Administered 2017-03-15 (×2): 100 ug via INTRAVENOUS

## 2017-03-15 MED ORDER — PROPOFOL 10 MG/ML IV BOLUS
INTRAVENOUS | Status: DC | PRN
  Administered 2017-03-15: 200 mg via INTRAVENOUS

## 2017-03-15 MED ORDER — MIDAZOLAM HCL 5 MG/5ML IJ SOLN
INTRAMUSCULAR | Status: DC | PRN
  Administered 2017-03-15: 2 mg via INTRAVENOUS

## 2017-03-15 MED ORDER — DEXAMETHASONE SODIUM PHOSPHATE 10 MG/ML IJ SOLN
INTRAMUSCULAR | Status: DC | PRN
  Administered 2017-03-15: 10 mg via INTRAVENOUS

## 2017-03-15 MED ORDER — PHENYLEPHRINE 40 MCG/ML (10ML) SYRINGE FOR IV PUSH (FOR BLOOD PRESSURE SUPPORT)
PREFILLED_SYRINGE | INTRAVENOUS | Status: AC
  Filled 2017-03-15: qty 10

## 2017-03-15 MED ORDER — ONDANSETRON HCL 4 MG/2ML IJ SOLN
INTRAMUSCULAR | Status: AC
  Filled 2017-03-15: qty 2

## 2017-03-15 MED ORDER — LACTATED RINGERS IV SOLN
INTRAVENOUS | Status: DC
  Administered 2017-03-15: 09:00:00 via INTRAVENOUS

## 2017-03-15 MED ORDER — LIDOCAINE 2% (20 MG/ML) 5 ML SYRINGE
INTRAMUSCULAR | Status: DC | PRN
  Administered 2017-03-15: 40 mg via INTRAVENOUS

## 2017-03-15 MED ORDER — FENTANYL CITRATE (PF) 250 MCG/5ML IJ SOLN
INTRAMUSCULAR | Status: AC
  Filled 2017-03-15: qty 5

## 2017-03-15 MED ORDER — ACETAMINOPHEN 325 MG PO TABS
650.0000 mg | ORAL_TABLET | Freq: Four times a day (QID) | ORAL | Status: DC | PRN
  Administered 2017-03-15 (×2): 650 mg via ORAL
  Filled 2017-03-15 (×2): qty 2

## 2017-03-15 MED ORDER — PIPERACILLIN-TAZOBACTAM 3.375 G IVPB
3.3750 g | Freq: Three times a day (TID) | INTRAVENOUS | Status: DC
  Administered 2017-03-15 (×2): 3.375 g via INTRAVENOUS
  Filled 2017-03-15 (×5): qty 50

## 2017-03-15 SURGICAL SUPPLY — 32 items
BNDG GAUZE ELAST 4 BULKY (GAUZE/BANDAGES/DRESSINGS) IMPLANT
COVER MAYO STAND STRL (DRAPES) ×4 IMPLANT
COVER SURGICAL LIGHT HANDLE (MISCELLANEOUS) ×2 IMPLANT
DRAIN PENROSE 1/2X12 LTX STRL (WOUND CARE) ×2 IMPLANT
ELECT CAUTERY BLADE 6.4 (BLADE) ×2 IMPLANT
ELECT REM PT RETURN 9FT ADLT (ELECTROSURGICAL) ×2
ELECTRODE REM PT RTRN 9FT ADLT (ELECTROSURGICAL) ×1 IMPLANT
GAUZE SPONGE 2X2 8PLY STRL LF (GAUZE/BANDAGES/DRESSINGS) ×1 IMPLANT
GAUZE SPONGE 4X4 12PLY STRL (GAUZE/BANDAGES/DRESSINGS) IMPLANT
GLOVE BIO SURGEON STRL SZ7 (GLOVE) ×2 IMPLANT
GLOVE BIOGEL PI IND STRL 7.5 (GLOVE) ×1 IMPLANT
GLOVE BIOGEL PI INDICATOR 7.5 (GLOVE) ×1
GOWN STRL REUS W/ TWL LRG LVL3 (GOWN DISPOSABLE) ×2 IMPLANT
GOWN STRL REUS W/TWL LRG LVL3 (GOWN DISPOSABLE) ×2
KIT BASIN OR (CUSTOM PROCEDURE TRAY) ×2 IMPLANT
KIT ROOM TURNOVER OR (KITS) ×2 IMPLANT
NS IRRIG 1000ML POUR BTL (IV SOLUTION) ×2 IMPLANT
PACK LITHOTOMY IV (CUSTOM PROCEDURE TRAY) ×2 IMPLANT
PAD ABD 8X10 STRL (GAUZE/BANDAGES/DRESSINGS) ×2 IMPLANT
PAD ARMBOARD 7.5X6 YLW CONV (MISCELLANEOUS) ×2 IMPLANT
PENCIL BUTTON HOLSTER BLD 10FT (ELECTRODE) ×2 IMPLANT
SPONGE GAUZE 2X2 STER 10/PKG (GAUZE/BANDAGES/DRESSINGS) ×1
SPONGE LAP 18X18 X RAY DECT (DISPOSABLE) ×2 IMPLANT
SURGILUBE 2OZ TUBE FLIPTOP (MISCELLANEOUS) ×2 IMPLANT
SUT ETHILON 2 0 FS 18 (SUTURE) ×2 IMPLANT
SWAB COLLECTION DEVICE MRSA (MISCELLANEOUS) ×2 IMPLANT
SWAB CULTURE ESWAB REG 1ML (MISCELLANEOUS) ×2 IMPLANT
SYR BULB 3OZ (MISCELLANEOUS) ×2 IMPLANT
TOWEL OR 17X24 6PK STRL BLUE (TOWEL DISPOSABLE) ×2 IMPLANT
TOWEL OR 17X26 10 PK STRL BLUE (TOWEL DISPOSABLE) ×2 IMPLANT
TUBE CONNECTING 12X1/4 (SUCTIONS) ×2 IMPLANT
YANKAUER SUCT BULB TIP NO VENT (SUCTIONS) ×2 IMPLANT

## 2017-03-15 NOTE — Anesthesia Preprocedure Evaluation (Signed)
Anesthesia Evaluation  Patient identified by MRN, date of birth, ID band Patient awake    Reviewed: Allergy & Precautions, NPO status , Patient's Chart, lab work & pertinent test results  Airway Mallampati: I   Neck ROM: Full    Dental no notable dental hx.    Pulmonary neg pulmonary ROS, Current Smoker,    breath sounds clear to auscultation       Cardiovascular negative cardio ROS   Rhythm:Regular Rate:Normal     Neuro/Psych negative neurological ROS  negative psych ROS   GI/Hepatic negative GI ROS, Neg liver ROS, (+)     substance abuse  alcohol use and marijuana use,   Endo/Other  negative endocrine ROS  Renal/GU negative Renal ROS  negative genitourinary   Musculoskeletal negative musculoskeletal ROS (+)   Abdominal   Peds negative pediatric ROS (+)  Hematology negative hematology ROS (+)   Anesthesia Other Findings   Reproductive/Obstetrics negative OB ROS                             Anesthesia Physical Anesthesia Plan  ASA: I  Anesthesia Plan: General   Post-op Pain Management:    Induction: Intravenous  PONV Risk Score and Plan: 2 and Ondansetron and Dexamethasone  Airway Management Planned: Oral ETT  Additional Equipment:   Intra-op Plan:   Post-operative Plan: Extubation in OR  Informed Consent: I have reviewed the patients History and Physical, chart, labs and discussed the procedure including the risks, benefits and alternatives for the proposed anesthesia with the patient or authorized representative who has indicated his/her understanding and acceptance.   Dental advisory given  Plan Discussed with: CRNA  Anesthesia Plan Comments:         Anesthesia Quick Evaluation

## 2017-03-15 NOTE — Anesthesia Postprocedure Evaluation (Signed)
Anesthesia Post Note  Patient: Lona Millardimothy L Pazos  Procedure(s) Performed: Procedure(s) (LRB): IRRIGATION AND DEBRIDEMENT PERIRECTAL ABSCESS (N/A)     Patient location during evaluation: PACU Anesthesia Type: General Level of consciousness: awake and alert Pain management: pain level controlled Vital Signs Assessment: post-procedure vital signs reviewed and stable Respiratory status: spontaneous breathing, nonlabored ventilation, respiratory function stable and patient connected to nasal cannula oxygen Cardiovascular status: blood pressure returned to baseline and stable Postop Assessment: no signs of nausea or vomiting Anesthetic complications: no    Last Vitals:  Vitals:   03/15/17 1347 03/15/17 1725  BP: 102/62 (!) 101/55  Pulse: 60 70  Resp: 14 16  Temp: 36.6 C 36.4 C  SpO2: 100% 100%    Last Pain:  Vitals:   03/15/17 1725  TempSrc: Axillary  PainSc:                  Daven Montz,JAMES TERRILL

## 2017-03-15 NOTE — Progress Notes (Signed)
Pharmacy Antibiotic Note  Justin Weaver is a 41 y.o. male admitted on 03/14/2017 with perianal abscess. Pharmacy has been consulted for Zosyn dosing.  WBC is elevated at 12.2 and patient is afebrile. SCr is 1.1 for estimated CrCl ~ 90 mL/min.   Plan: Zosyn 3.375g IV q8hr  Monitor renal function, clinical picture, and culture results F/u length of therapy and surgical plans   Weight: 150 lb (68 kg)  Temp (24hrs), Avg:98.5 F (36.9 C), Min:98.1 F (36.7 C), Max:98.8 F (37.1 C)   Recent Labs Lab 03/14/17 2156 03/14/17 2242  WBC 12.2*  --   CREATININE  --  1.10    CrCl cannot be calculated (Unknown ideal weight.).    No Known Allergies  Antimicrobials this admission: 8/13 Zosyn >>  8/13 Vanc x1   Dose adjustments this admission: n/a  Microbiology results: n/a  York CeriseKatherine Cook, PharmD Clinical Pharmacist 03/15/17 12:42 AM

## 2017-03-15 NOTE — Op Note (Signed)
Preop diagnosis: Perirectal abscess Postop diagnosis: Perineal abscess Procedure performed: Incision and drainage of perineal abscess Surgeon:Kenaz Olafson K. Anesthesia: Gen. Indications: This is a 41 year old male who presents with several days of perirectal and perineal pain that presented to the emergency department for evaluation. He was found to have a spontaneously draining abscess. CT scan showed that the abscess tract anteriorly towards the scrotum. Presents now for incision and drainage.  Description of procedure: The patient is brought to the operating room placed in a supine position on the operating room table. After an adequate level of general anesthesia was obtained, his legs were placed in lithotomy position in yellowfin stirrups. His perineum was prepped with Betadine and draped in sterile fashion. A timeout was taken to ensure the proper patient proper procedure. The patient has a spontaneous opening anterior and to the right of his anus. We cultured the drainage, from this area. We enlarged the wound to about 2 cm. We explored the wound and it seems to track superiorly and anteriorly. The abscess cavity seems to come close the skin in the right inguinal crease. We made a counterincision this area. We irrigated the wound thoroughly. There is no further purulent fluid. The abscess discussing the tunnel and any other directions. We passed a half-inch Penrose drain between the 2 incisions and sutured in place with 2-0 nylon sutures. Some moistened gauze was packed around the lower wound. A dry dressing was applied. The patient was then extubated and brought to the recovery room in stable condition. All sponge, instrument, and needle counts are correct.  Wilmon ArmsMatthew K. Corliss Skainssuei, MD, [facility]  General/ Trauma Surgery  03/15/2017 9:46 AM

## 2017-03-15 NOTE — Transfer of Care (Signed)
Immediate Anesthesia Transfer of Care Note  Patient: Justin Weaver  Procedure(s) Performed: Procedure(s) with comments: IRRIGATION AND DEBRIDEMENT PERIRECTAL ABSCESS (N/A) - Prone position  Patient Location: PACU  Anesthesia Type:General  Level of Consciousness: patient cooperative and responds to stimulation  Airway & Oxygen Therapy: Patient Spontanous Breathing and Patient connected to nasal cannula oxygen  Post-op Assessment: Report given to RN and Post -op Vital signs reviewed and stable  Post vital signs: Reviewed and stable  Last Vitals:  Vitals:   03/15/17 0458 03/15/17 0752  BP: 97/76 117/63  Pulse: 63 74  Resp: 17 15  Temp: 36.5 C 36.9 C  SpO2: 99% 100%    Last Pain:  Vitals:   03/15/17 0752  TempSrc: Oral  PainSc:          Complications: No apparent anesthesia complications

## 2017-03-15 NOTE — ED Notes (Signed)
General surgeon at the bedside 

## 2017-03-15 NOTE — Progress Notes (Signed)
Received patient from ED. Patient AOx4, ambulatory, VS stable, O2Sat at 100% on RA, pain at 6/10.  Patient oriented to room , bed controls and call light.  Administered PRN pain medication dilaudid 1mg  IV per order.  Informed consent signed for surgery later at 0830.  Preop done.  Will monitor.

## 2017-03-15 NOTE — ED Notes (Signed)
Report attempted 

## 2017-03-15 NOTE — Progress Notes (Addendum)
Patient refused blood draw for HIV testing despite this RN's explanation prior to surgery  and stated he had a lot of sticks downstairs. On call [facility] MD J. Lindie SpruceWyatt was made aware.

## 2017-03-15 NOTE — Progress Notes (Addendum)
Dr. Megan MansJ. Wyatt called back and inform this RN that it's alright for patient to refuse blood draw and not to do any blood draw for HIV testing.  Phlebotomist was informed accordingly. Will endorse to day shift RN appropriately.

## 2017-03-15 NOTE — H&P (Signed)
Justin Weaver is an 41 y.o. male.   Chief Complaint: Perirectal and scrotal pain HPI: Patient with a history of this same problem about two years ago with spontaneous drainage of a perirectal abscess.  This current problem is in the exact same area, has started to spontaneous drain earlier today, but comes in now with continued pain and air tracking up towards the scrotum.  History reviewed. No pertinent past medical history.  History reviewed. No pertinent surgical history.  History reviewed. No pertinent family history. Social History:  reports that he has been smoking Cigarettes.  He has been smoking about 0.25 packs per day. He has never used smokeless tobacco. He reports that he drinks alcohol. He reports that he uses drugs, including Marijuana.  Allergies: No Known Allergies   (Not in a hospital admission)  Results for orders placed or performed during the hospital encounter of 03/14/17 (from the past 48 hour(s))  CBC with Differential     Status: Abnormal   Collection Time: 03/14/17  9:56 PM  Result Value Ref Range   WBC 12.2 (H) 4.0 - 10.5 K/uL   RBC 3.92 (L) 4.22 - 5.81 MIL/uL   Hemoglobin 10.5 (L) 13.0 - 17.0 g/dL   HCT 16.131.8 (L) 09.639.0 - 04.552.0 %   MCV 81.1 78.0 - 100.0 fL   MCH 26.8 26.0 - 34.0 pg   MCHC 33.0 30.0 - 36.0 g/dL   RDW 40.915.4 81.111.5 - 91.415.5 %   Platelets 278 150 - 400 K/uL   Neutrophils Relative % 66 %   Lymphocytes Relative 19 %   Monocytes Relative 13 %   Eosinophils Relative 1 %   Basophils Relative 1 %   Neutro Abs 8.1 (H) 1.7 - 7.7 K/uL   Lymphs Abs 2.3 0.7 - 4.0 K/uL   Monocytes Absolute 1.6 (H) 0.1 - 1.0 K/uL   Eosinophils Absolute 0.1 0.0 - 0.7 K/uL   Basophils Absolute 0.1 0.0 - 0.1 K/uL   RBC Morphology POLYCHROMASIA PRESENT    WBC Morphology TOXIC GRANULATION     Comment: VACUOLATED NEUTROPHILS  I-stat chem 8, ed     Status: Abnormal   Collection Time: 03/14/17 10:42 PM  Result Value Ref Range   Sodium 139 135 - 145 mmol/L   Potassium 3.1 (L)  3.5 - 5.1 mmol/L   Chloride 102 101 - 111 mmol/L   BUN 14 6 - 20 mg/dL   Creatinine, Ser 7.821.10 0.61 - 1.24 mg/dL   Glucose, Bld 956103 (H) 65 - 99 mg/dL   Calcium, Ion 2.131.04 (L) 1.15 - 1.40 mmol/L   TCO2 27 0 - 100 mmol/L   Hemoglobin 10.9 (L) 13.0 - 17.0 g/dL   HCT 08.632.0 (L) 57.839.0 - 46.952.0 %   Ct Pelvis W Contrast  Result Date: 03/14/2017 CLINICAL DATA:  Fever and abdominal/pelvic pain. Purulent drainage. Abscess. EXAM: CT PELVIS WITH CONTRAST TECHNIQUE: Multidetector CT imaging of the pelvis was performed using the standard protocol following the bolus administration of intravenous contrast. CONTRAST:  100mL ISOVUE-300 IOPAMIDOL (ISOVUE-300) INJECTION 61% COMPARISON:  None. FINDINGS: Urinary Tract: Question of bladder wall thickening. No evidence of bladder stone. Distal ureters are decompressed. Bowel: Suboptimal bowel assessment given lack of enteric contrast and paucity of intra-abdominal/pelvic fat. Pelvic bowel loops are fluid-filled without evidence of obstruction. The appendix is not confidently identified. No definite perianal fluid collection. Vascular/Lymphatic: There are enlarged bilateral external and inguinal wound lymph nodes. No acute vascular findings are seen. Reproductive: There is air in the left perineum  extending to the penile scrotal junction. There is adjacent ill-defined thickening. Scrotal contents not well-defined by CT. Prostate gland is unremarkable. Other: There is air in the deep perineal structures on the left. Associated soft tissue thickening and possible ill-defined fluid, that may extending into the left scrotal sac. Fluid measures approximately 1.5 cm. No pelvic free fluid. Musculoskeletal: There are no acute or suspicious osseous abnormalities. IMPRESSION: Air in the left perineum with adjacent soft tissue thickening and ill-defined fluid, extending into the base of the penile scrotal junction and possible scrotum, consistent with infection. No well-defined abscess is  visualized. Given location and soft tissue air, necrotizing infection is considered. These results were called by telephone at the time of interpretation on 03/14/2017 at 11:35 pm to NP [facility] , who verbally acknowledged these results. Electronically Signed   By: Rubye Oaks M.D.   On: 03/14/2017 23:36    Review of Systems  Constitutional: Positive for chills and fever.  Genitourinary: Positive for dysuria.  All other systems reviewed and are negative.   Blood pressure 120/87, pulse 62, temperature 98.1 F (36.7 C), temperature source Oral, resp. rate 14, weight 68 kg (150 lb), SpO2 100 %. Physical Exam  Constitutional: He is oriented to person, place, and time. He appears well-developed and well-nourished.  slim  HENT:  Head: Normocephalic and atraumatic.  Eyes: Pupils are equal, round, and reactive to light. Conjunctivae and EOM are normal.  Neck: Normal range of motion. Neck supple.  Cardiovascular: Normal rate, regular rhythm and normal heart sounds.   Respiratory: Effort normal and breath sounds normal.  GI: Soft. Bowel sounds are normal.  Genitourinary: Penis normal.     Musculoskeletal: Normal range of motion.  Neurological: He is alert and oriented to person, place, and time. He has normal reflexes.  Skin: Skin is warm and dry.  Psychiatric: He has a normal mood and affect. His behavior is normal. Judgment and thought content normal.     Assessment/Plan Perirectal abscess extending up towards the scrotum associated with spontaneous draiange, but still with a significant abscess with some air seen on CT in the perineum.  The patient needs further I&D of this area, but it does not need to be don urgently because he is not septic and this does not appear to ba a Fournier's gangrene.  Even though there is air in the perineal wound, it likely has come from the open wound.There is bogginess in the perineal area, but the skin is completely intact and does nolt appear to be  a necrotizing fasciitis.  .  Since the patient ate dinner about three hours ago, will admikt the patient, place on IV antibiotics and keep NPO for drainage in the prone position tomorrow.  Jimmye Norman, MD 03/15/2017, 12:38 AM

## 2017-03-16 ENCOUNTER — Encounter (HOSPITAL_COMMUNITY): Payer: Self-pay | Admitting: Surgery

## 2017-03-16 LAB — HIV ANTIBODY (ROUTINE TESTING W REFLEX): HIV Screen 4th Generation wRfx: NONREACTIVE

## 2017-03-16 MED ORDER — OXYCODONE HCL 5 MG PO TABS: 5.0000 mg | ORAL_TABLET | Freq: Four times a day (QID) | ORAL | 0 refills | Status: AC | PRN

## 2017-03-16 MED ORDER — AMOXICILLIN-POT CLAVULANATE 875-125 MG PO TABS: 1.0000 | ORAL_TABLET | Freq: Two times a day (BID) | ORAL | 0 refills | Status: AC

## 2017-03-16 MED ORDER — MORPHINE SULFATE (PF) 4 MG/ML IV SOLN: 2.0000 mg | INTRAVENOUS | Status: DC | PRN

## 2017-03-16 MED ORDER — CHLORHEXIDINE GLUCONATE CLOTH 2 % EX PADS: 6.0000 | MEDICATED_PAD | Freq: Every day | CUTANEOUS | Status: DC

## 2017-03-16 MED ORDER — MUPIROCIN 2 % EX OINT: 1.0000 "application " | TOPICAL_OINTMENT | Freq: Two times a day (BID) | CUTANEOUS | Status: DC

## 2017-03-16 NOTE — Discharge Instructions (Signed)
Dressings  Frequent showers/ sitz baths Dry gauze over perineum - leave the Penrose drain alone.  I will remove it in the office Pain meds as needed.  Avoid constipation by using stool softeners. Oral Augmentin   How to Take a Sitz Bath A sitz bath is a warm water bath that is taken while you are sitting down. The water should only come up to your hips and should cover your buttocks. Your health care provider may recommend a sitz bath to help you:  Clean the lower part of your body, including your genital area.  With itching.  With pain.  With sore muscles or muscles that tighten or spasm.  How to take a sitz bath Take 3-4 sitz baths per day or as told by your health care provider. 1. Partially fill a bathtub with warm water. You will only need the water to be deep enough to cover your hips and buttocks when you are sitting in it. 2. If your health care provider told you to put medicine in the water, follow the directions exactly. 3. Sit in the water and open the tub drain a little. 4. Turn on the warm water again to keep the tub at the correct level. Keep the water running constantly. 5. Soak in the water for 15-20 minutes or as told by your health care provider. 6. After the sitz bath, pat the affected area dry first. Do not rub it. 7. Be careful when you stand up after the sitz bath because you may feel dizzy.  Contact a health care provider if:  Your symptoms get worse. Do not continue with sitz baths if your symptoms get worse.  You have new symptoms. Do not continue with sitz baths until you talk with your health care provider. This information is not intended to replace advice given to you by your health care provider. Make sure you discuss any questions you have with your health care provider. Document Released: 04/10/2004 Document Revised: 12/17/2015 Document Reviewed: 07/17/2014 Elsevier Interactive Patient Education  [employer]2018 [employer]

## 2017-03-16 NOTE — Discharge Summary (Signed)
Physician Discharge Summary  Patient ID: Justin Millardimothy L Gomm MRN: [id number] DOB/AGE: 1975/10/07 41 y.o.  Admit date: 03/14/2017 Discharge date: 03/16/2017  Admission Diagnoses:  Perirectal/ perineal abscess  Discharge Diagnoses: same Active Problems:   Perirectal abscess   Discharged Condition: good  Hospital Course: OR on 8/14 for incision and drainage of perineal abscess - Penrose drain placed.  Feels much better on POD#1  Consults: None  Significant Diagnostic Studies:  Treatments: Incision and drainage as above  Discharge Exam: Blood pressure (!) 103/57, pulse 66, temperature 97.7 F (36.5 C), temperature source Oral, resp. rate 16, height 6' (1.829 m), weight 70.6 kg (155 lb 9.6 oz), SpO2 100 %. General appearance: alert, cooperative and no distress Drain in place - decreased swelling  Disposition: 01-Home or Self Care  Discharge Instructions    Call MD for:  persistant nausea and vomiting    Complete by:  As directed    Call MD for:  redness, tenderness, or signs of infection (pain, swelling, redness, odor or green/yellow discharge around incision site)    Complete by:  As directed    Call MD for:  severe uncontrolled pain    Complete by:  As directed    Call MD for:  temperature >100.4    Complete by:  As directed    Diet general    Complete by:  As directed    Driving Restrictions    Complete by:  As directed    Do not drive while taking pain medications   Increase activity slowly    Complete by:  As directed    May shower / Bathe    Complete by:  As directed      Allergies as of 03/16/2017   No Known Allergies     Medication List    STOP taking these medications   oxyCODONE-acetaminophen 5-325 MG tablet Commonly known as:  PERCOCET/ROXICET     TAKE these medications   acetaminophen 325 MG tablet Commonly known as:  TYLENOL Take 325-650 mg by mouth every 6 (six) hours as needed (for pain or headaches).   amoxicillin-clavulanate 875-125 MG  tablet Commonly known as:  AUGMENTIN Take 1 tablet by mouth 2 (two) times daily.   ibuprofen 200 MG tablet Commonly known as:  ADVIL,MOTRIN Take 200-600 mg by mouth every 6 (six) hours as needed (for pain or headaches). Tooth pain   ibuprofen 800 MG tablet Commonly known as:  ADVIL,MOTRIN Take 1 tablet (800 mg total) by mouth 3 (three) times daily.   oxyCODONE 5 MG immediate release tablet Commonly known as:  Oxy IR/ROXICODONE Take 1 tablet (5 mg total) by mouth every 6 (six) hours as needed for severe pain.      Follow-up Information    Manus Ruddsuei, Hyder Deman, MD. Call.   Specialty:  General Surgery Why:  as soon as possiblet to schedule post-oprative follow up appointmnt in 1-2 weeks. Contact information: [address] [place] [address] [phone]           Signed: Hanaa Payes K. 03/16/2017, 8:17 AM

## 2017-03-16 NOTE — Care Management Note (Signed)
Case Management Note  Patient Details  Name: Justin Weaver MRN: [id number] Date of Birth: Feb 27, 1976  Subjective/Objective:                    Action/Plan:   Expected Discharge Date:  03/16/17               Expected Discharge Plan:  Home/Self Care  In-House Referral:     Discharge planning Services  CM Consult, MATCH Program, Medication Assistance, Indigent Health Clinic  Post Acute Care Choice:    Choice offered to:  Patient  DME Arranged:    DME Agency:     HH Arranged:    HH Agency:     Status of Service:  Completed, signed off  If discussed at [employer] Length of Stay Meetings, dates discussed:    Additional Comments:  Justin Weaver, Justin Sprunger Marie, RN 03/16/2017, 9:52 AM

## 2017-03-20 LAB — AEROBIC/ANAEROBIC CULTURE W GRAM STAIN (SURGICAL/DEEP WOUND)

## 2017-03-20 LAB — AEROBIC/ANAEROBIC CULTURE (SURGICAL/DEEP WOUND)

## 2017-08-04 ENCOUNTER — Other Ambulatory Visit: Payer: Self-pay | Admitting: Surgery

## 2017-08-04 DIAGNOSIS — N5089 Other specified disorders of the male genital organs: Secondary | ICD-10-CM

## 2017-08-09 ENCOUNTER — Ambulatory Visit (HOSPITAL_COMMUNITY): Admission: RE | Admit: 2017-08-09 | Payer: Self-pay | Source: Ambulatory Visit

## 2018-08-21 IMAGING — CT CT PELVIS W/ CM
2 of 3 series · 17 of 46 positions shown, 19 images · IV contrast (APPLIED)
Comparison: None.

CLINICAL DATA: Fever and abdominal/pelvic pain. Purulent drainage.
Abscess.

EXAM:
CT PELVIS WITH CONTRAST
TECHNIQUE: Multidetector CT imaging of the pelvis was performed using the
standard protocol following the bolus administration of intravenous
contrast.
CONTRAST:  100mL 59DHFZ-L77 IOPAMIDOL (59DHFZ-L77) INJECTION 61%

[Series 3: pelvis w 5.0 i30f 2 · axial · 0.76mm/px · z∈[+702,+1007]mm · 14 of 71 slices shown, 16 images]
[im 5/71  soft-tissue]
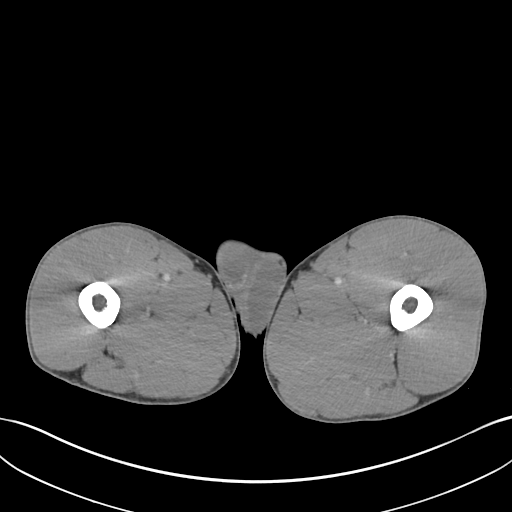
[im 5/71  bone]
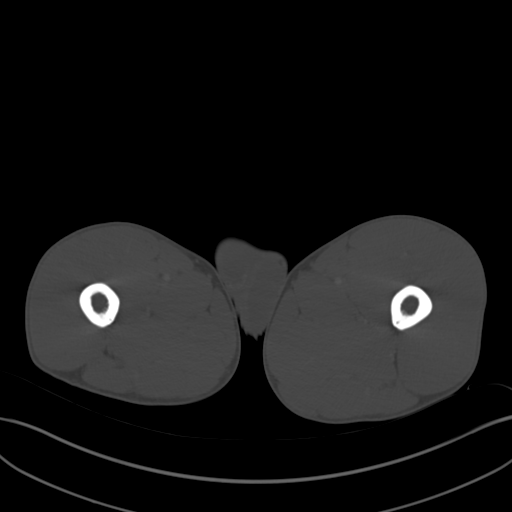
[im 10/71  soft-tissue]
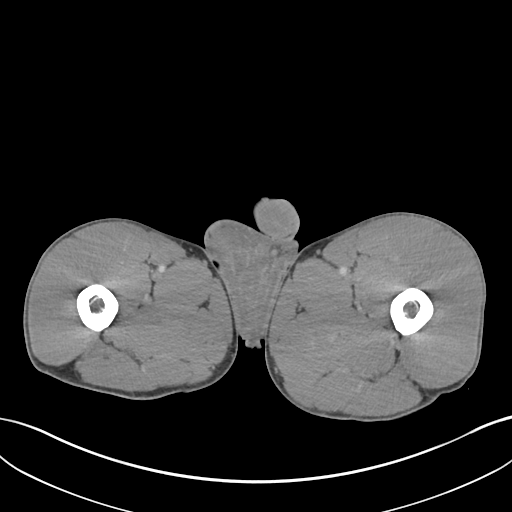
[im 14/71  soft-tissue]
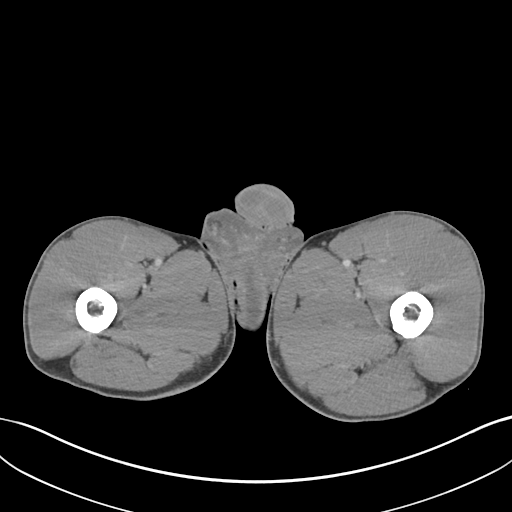
[im 19/71  soft-tissue]
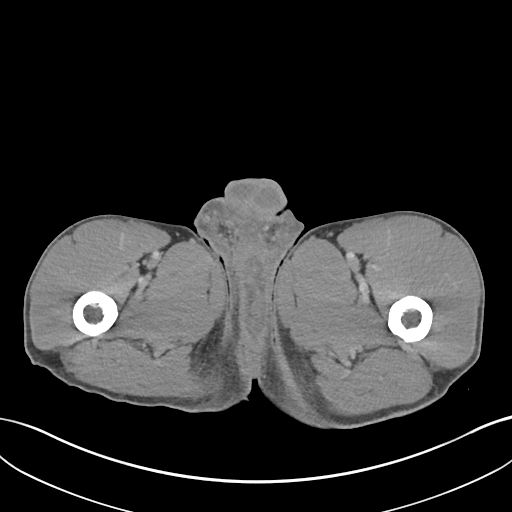
[im 23/71  soft-tissue]
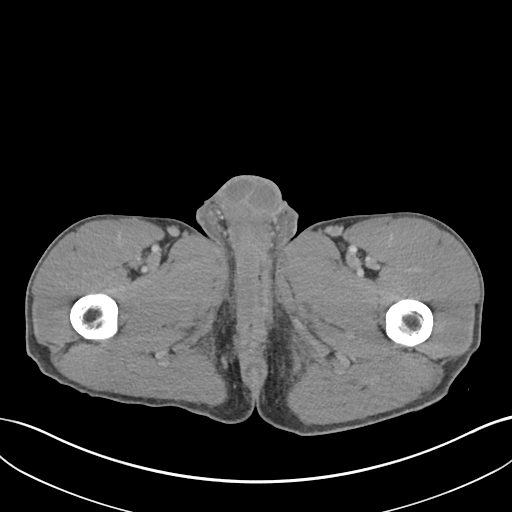
[im 28/71  soft-tissue]
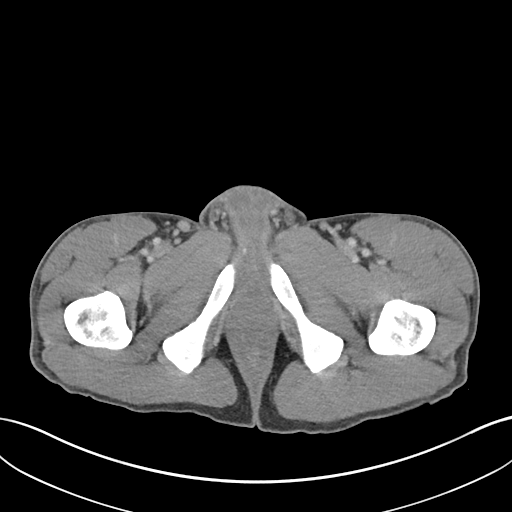
[im 32/71  soft-tissue]
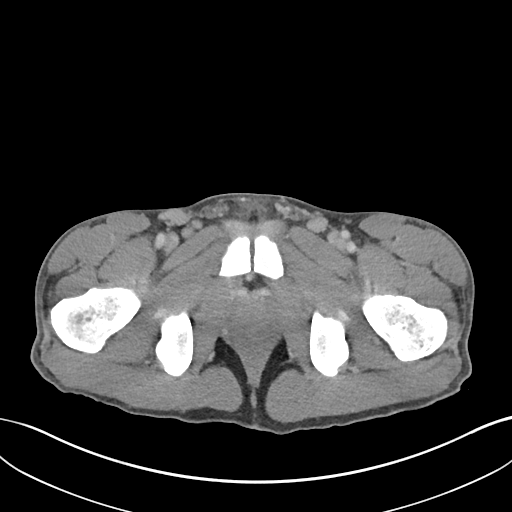
[im 39/71  soft-tissue]
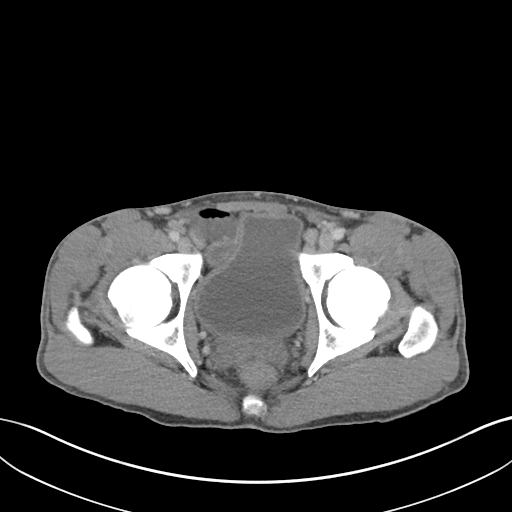
[im 43/71  soft-tissue]
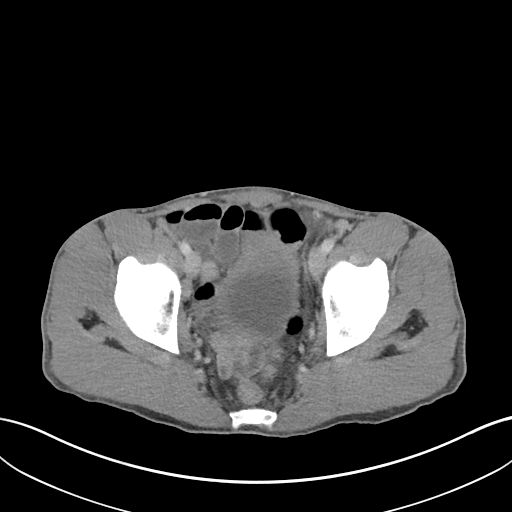
[im 43/71  bone]
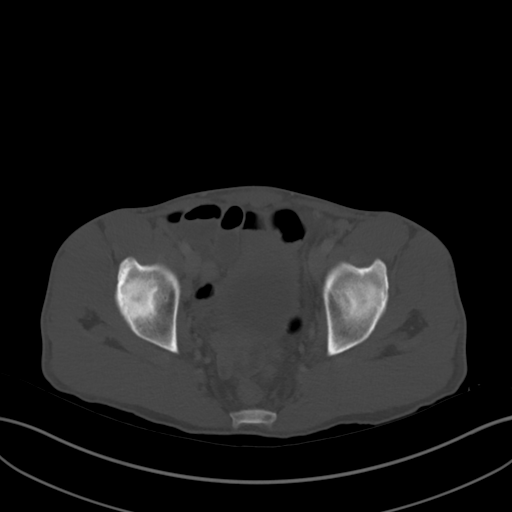
[im 48/71  soft-tissue]
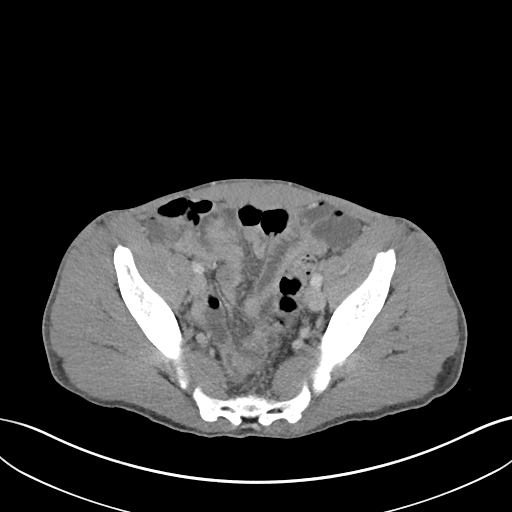
[im 52/71  soft-tissue]
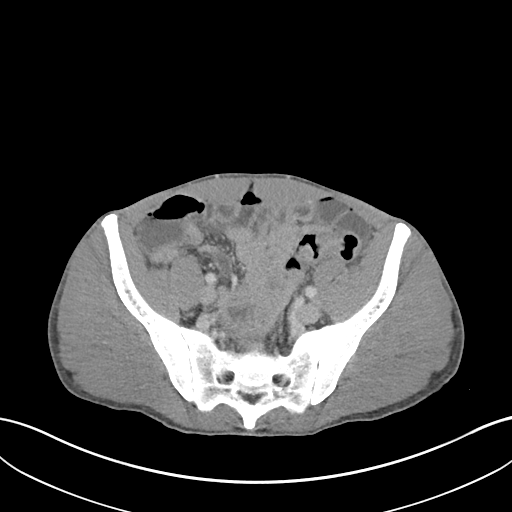
[im 57/71  soft-tissue]
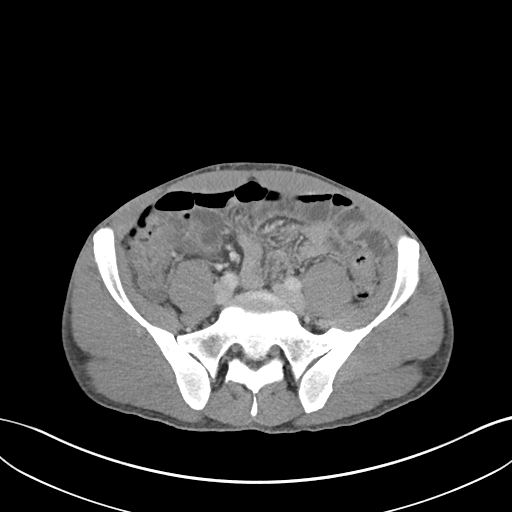
[im 61/71  soft-tissue]
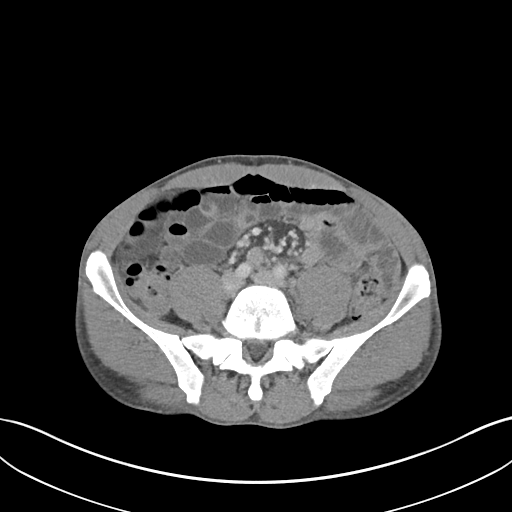
[im 66/71  soft-tissue]
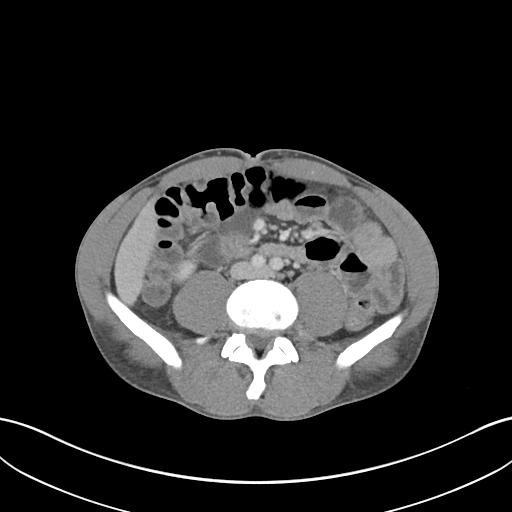

[Series 5: coronal soft tissue · coronal · 0.68mm/px · 3 of 75 slices shown]
[im 25/75  soft-tissue]
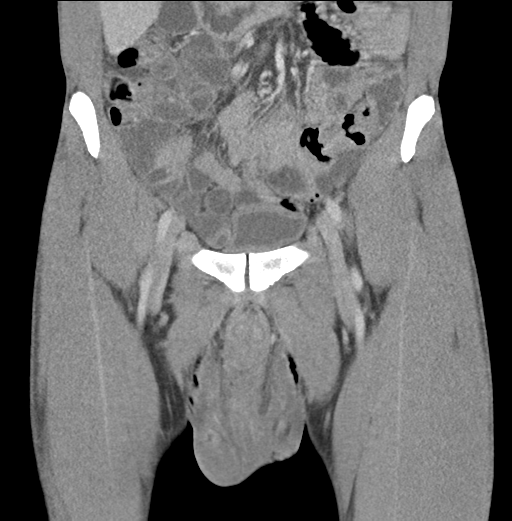
[im 33/75  soft-tissue]
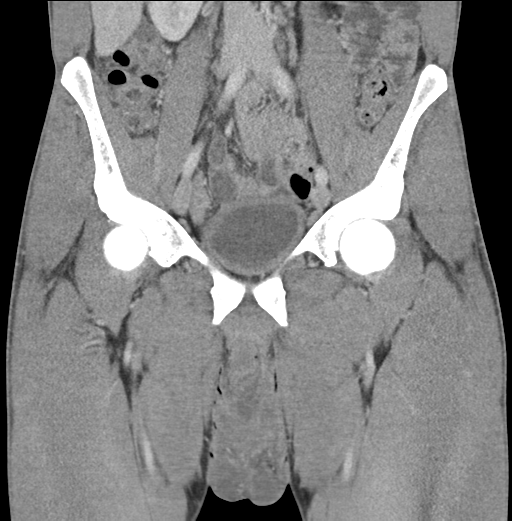
[im 42/75  soft-tissue]
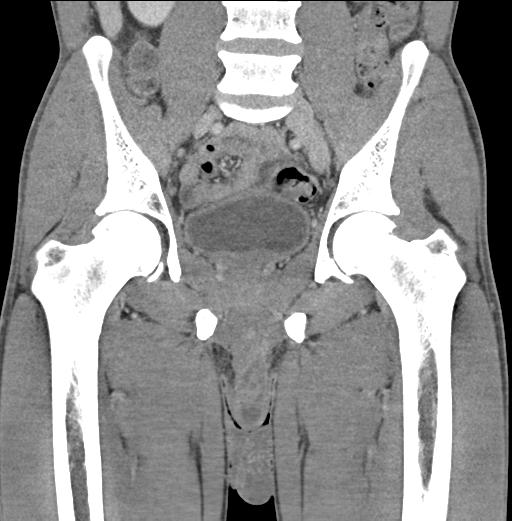

[17 of 46 positions shown; findings below may reference images not displayed]

FINDINGS: Urinary Tract: Question of bladder wall thickening. No evidence of
bladder stone. Distal ureters are decompressed.

Bowel: Suboptimal bowel assessment given lack of enteric contrast
and paucity of intra-abdominal/pelvic fat. Pelvic bowel loops are
fluid-filled without evidence of obstruction. The appendix is not
confidently identified. No definite perianal fluid collection.

Vascular/Lymphatic: There are enlarged bilateral external and
inguinal wound lymph nodes. No acute vascular findings are seen.

Reproductive: There is air in the left perineum extending to the
penile scrotal junction. There is adjacent ill-defined thickening.
Scrotal contents not well-defined by CT. Prostate gland is
unremarkable.

Other: There is air in the deep perineal structures on the left.
Associated soft tissue thickening and possible ill-defined fluid,
that may extending into the left scrotal sac. Fluid measures
approximately 1.5 cm. No pelvic free fluid.

Musculoskeletal: There are no acute or suspicious osseous
abnormalities.
IMPRESSION: Air in the left perineum with adjacent soft tissue thickening and
ill-defined fluid, extending into the base of the penile scrotal
junction and possible scrotum, consistent with infection. No
well-defined abscess is visualized. Given location and soft tissue
air, necrotizing infection is considered.

These results were called by telephone at the time of interpretation
on 03/14/2017 at [DATE] to NP DAYVISON SELLES , who verbally
acknowledged these results.

## 2021-03-06 ENCOUNTER — Emergency Department (HOSPITAL_BASED_OUTPATIENT_CLINIC_OR_DEPARTMENT_OTHER)
Admission: EM | Admit: 2021-03-06 | Discharge: 2021-03-06 | Disposition: A | Discharge status: Home / Self Care | Payer: Self-pay | Attending: Emergency Medicine | Admitting: Emergency Medicine

## 2021-03-06 ENCOUNTER — Encounter (HOSPITAL_BASED_OUTPATIENT_CLINIC_OR_DEPARTMENT_OTHER): Payer: Self-pay

## 2021-03-06 ENCOUNTER — Other Ambulatory Visit: Payer: Self-pay

## 2021-03-06 DIAGNOSIS — R1030 Lower abdominal pain, unspecified: Secondary | ICD-10-CM | POA: Insufficient documentation

## 2021-03-06 DIAGNOSIS — Z5321 Procedure and treatment not carried out due to patient leaving prior to being seen by health care provider: Secondary | ICD-10-CM | POA: Insufficient documentation

## 2021-03-06 HISTORY — DX: Spinal stenosis, site unspecified: M48.00

## 2021-03-06 NOTE — ED Triage Notes (Signed)
Pt presents stating he has 2 day history of right groin pain.  States he had an abscess to this same area 4 years ago and had surgery for this issue.

## 2021-03-06 NOTE — ED Notes (Signed)
Pt informed registration he was leaving and left the facility.
